# Patient Record
Sex: Female | Born: 1975 | Race: White | Hispanic: Yes | Marital: Married | Smoking: Never smoker
Health system: Southern US, Community
[De-identification: ages and names within clinical notes are randomized; demographics above are authoritative.]

## PROBLEM LIST (undated history)

## (undated) DIAGNOSIS — R112 Nausea with vomiting, unspecified: Secondary | ICD-10-CM

## (undated) DIAGNOSIS — R87619 Unspecified abnormal cytological findings in specimens from cervix uteri: Secondary | ICD-10-CM

## (undated) DIAGNOSIS — D219 Benign neoplasm of connective and other soft tissue, unspecified: Secondary | ICD-10-CM

## (undated) DIAGNOSIS — Z9889 Other specified postprocedural states: Secondary | ICD-10-CM

## (undated) DIAGNOSIS — O9902 Anemia complicating childbirth: Secondary | ICD-10-CM

## (undated) DIAGNOSIS — Z6740 Type O blood, Rh positive: Secondary | ICD-10-CM

## (undated) DIAGNOSIS — O039 Complete or unspecified spontaneous abortion without complication: Secondary | ICD-10-CM

## (undated) DIAGNOSIS — IMO0002 Reserved for concepts with insufficient information to code with codable children: Secondary | ICD-10-CM

## (undated) DIAGNOSIS — D259 Leiomyoma of uterus, unspecified: Secondary | ICD-10-CM

## (undated) DIAGNOSIS — D62 Acute posthemorrhagic anemia: Secondary | ICD-10-CM

## (undated) HISTORY — DX: Type O blood, Rh positive: Z67.40

## (undated) HISTORY — DX: Complete or unspecified spontaneous abortion without complication: O03.9

## (undated) HISTORY — DX: Leiomyoma of uterus, unspecified: D25.9

---

## 2010-02-23 ENCOUNTER — Ambulatory Visit: Payer: Self-pay | Admitting: Gynecology

## 2010-02-23 ENCOUNTER — Other Ambulatory Visit: Admission: RE | Admit: 2010-02-23 | Discharge: 2010-02-23 | Payer: Self-pay | Admitting: Gynecology

## 2010-03-16 ENCOUNTER — Ambulatory Visit: Payer: Self-pay | Admitting: Gynecology

## 2010-03-27 ENCOUNTER — Ambulatory Visit: Payer: Self-pay | Admitting: Gynecology

## 2010-03-30 ENCOUNTER — Ambulatory Visit: Payer: Self-pay | Admitting: Gynecology

## 2010-03-30 ENCOUNTER — Emergency Department (HOSPITAL_COMMUNITY): Admission: EM | Admit: 2010-03-30 | Discharge: 2010-03-31 | Payer: Self-pay | Admitting: Emergency Medicine

## 2010-04-02 ENCOUNTER — Ambulatory Visit: Payer: Self-pay | Admitting: Women's Health

## 2010-04-07 ENCOUNTER — Ambulatory Visit: Payer: Self-pay | Admitting: Gynecology

## 2010-04-13 ENCOUNTER — Ambulatory Visit: Payer: Self-pay | Admitting: Gynecology

## 2010-04-22 ENCOUNTER — Ambulatory Visit: Payer: Self-pay | Admitting: Gynecology

## 2010-07-06 ENCOUNTER — Other Ambulatory Visit: Payer: Self-pay | Admitting: Gynecology

## 2010-07-06 DIAGNOSIS — Z1231 Encounter for screening mammogram for malignant neoplasm of breast: Secondary | ICD-10-CM

## 2010-07-17 ENCOUNTER — Ambulatory Visit (HOSPITAL_COMMUNITY): Payer: BC Managed Care – PPO | Attending: Gynecology

## 2011-02-16 ENCOUNTER — Encounter: Payer: Self-pay | Admitting: Gynecology

## 2011-02-16 ENCOUNTER — Ambulatory Visit (INDEPENDENT_AMBULATORY_CARE_PROVIDER_SITE_OTHER): Payer: BC Managed Care – PPO | Admitting: Gynecology

## 2011-02-16 VITALS — BP 118/70

## 2011-02-16 DIAGNOSIS — R823 Hemoglobinuria: Secondary | ICD-10-CM

## 2011-02-16 DIAGNOSIS — N390 Urinary tract infection, site not specified: Secondary | ICD-10-CM

## 2011-02-16 DIAGNOSIS — N898 Other specified noninflammatory disorders of vagina: Secondary | ICD-10-CM

## 2011-02-16 DIAGNOSIS — R300 Dysuria: Secondary | ICD-10-CM

## 2011-02-16 DIAGNOSIS — B373 Candidiasis of vulva and vagina: Secondary | ICD-10-CM

## 2011-02-16 IMAGING — US US OB COMP LESS 14 WK
1 series · 13 of 28 positions shown · non-contrast
Comparison: None

CLINICAL DATA: Vaginal bleeding, cramping, pregnant per patient;
reportedly had a viable fetus at oblique office 2 days ago per
nurse, though no results from any prior imaging studies are
available; no quantitative beta HCG for correlation

OBSTETRIC <14 WK US AND TRANSVAGINAL OB US
TECHNIQUE: Both transabdominal and transvaginal ultrasound
examinations were performed for complete evaluation of the
gestation as well as the maternal uterus, adnexal regions, and
pelvic cul-de-sac.  Transvaginal technique was performed to assess
early pregnancy.

[Series 1: us ob comp less 14 wk · 0.24mm/px · 13 of 40 slices shown]
[im 2/40]
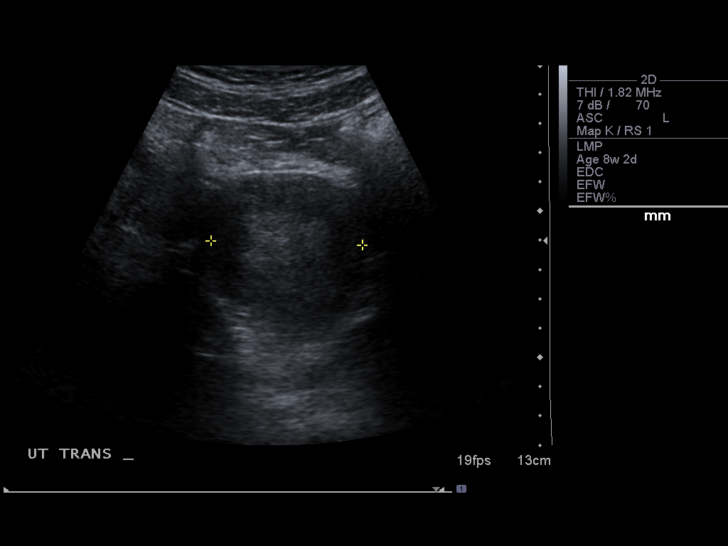
[im 5/40]
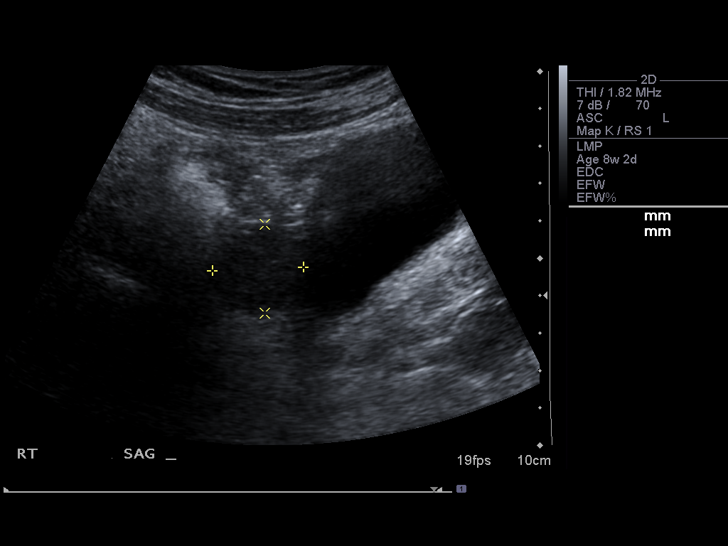
[im 8/40]
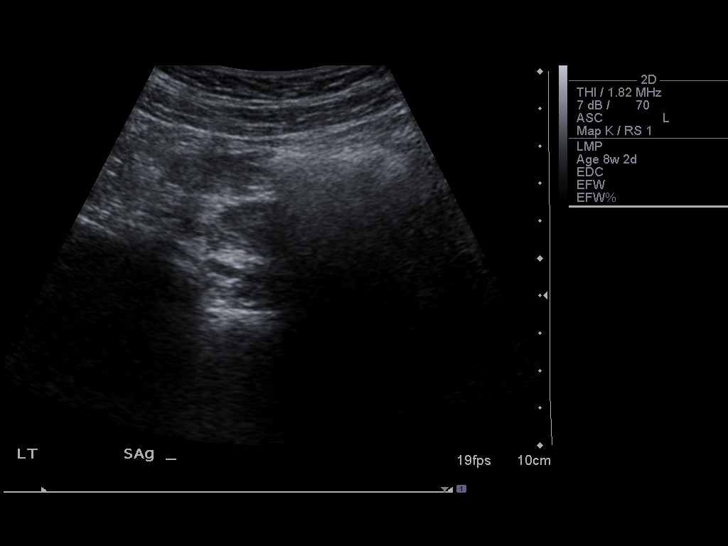
[im 11/40]
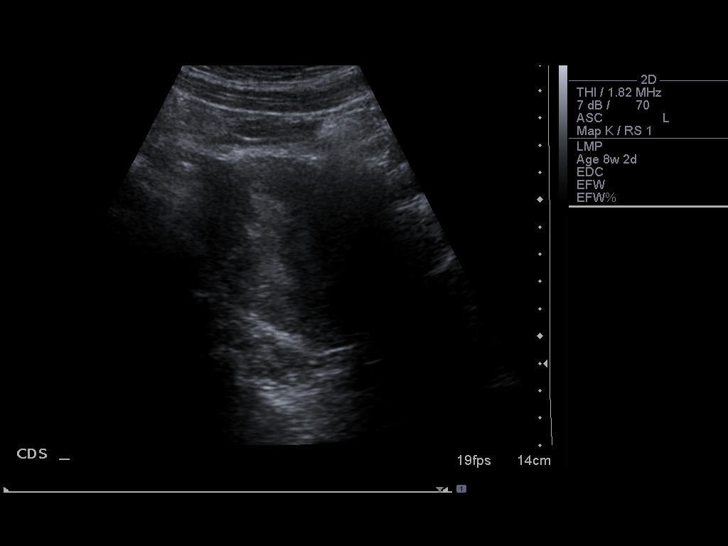
[im 14/40]
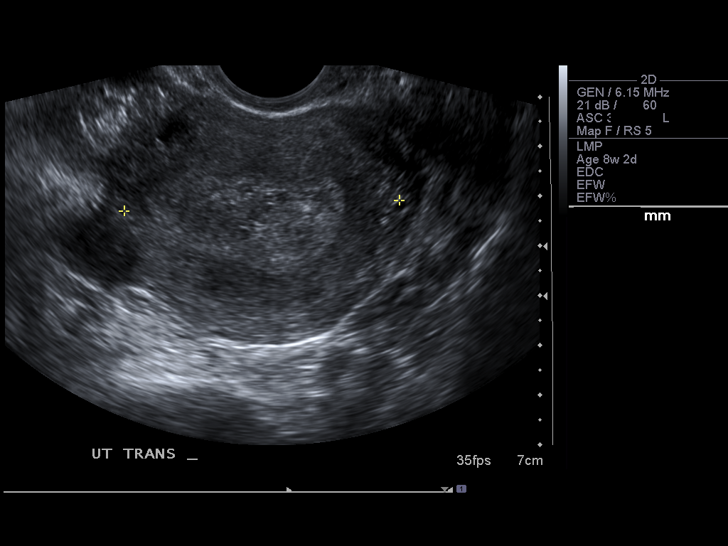
[im 16/40]
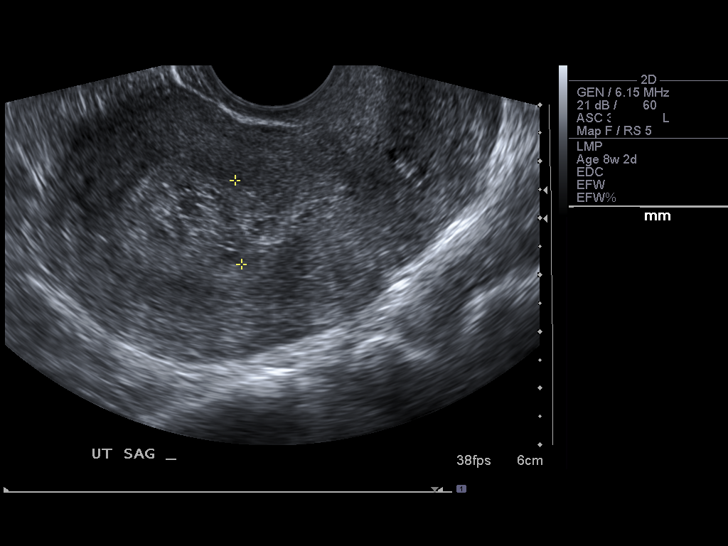
[im 21/40]
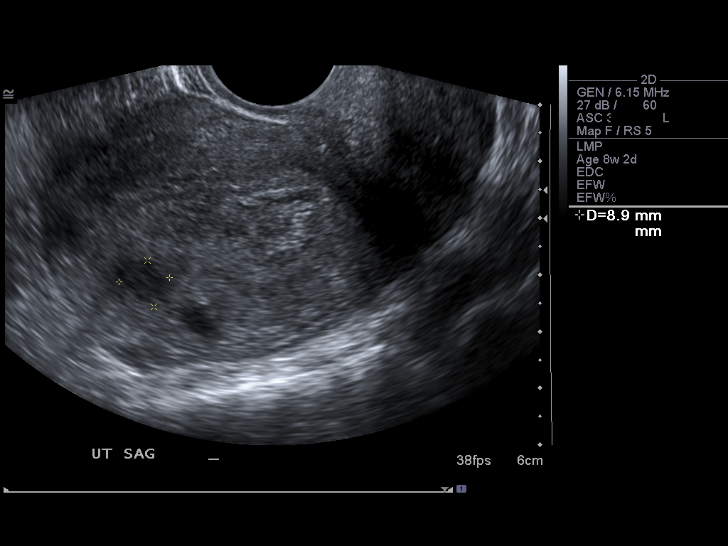
[im 24/40]
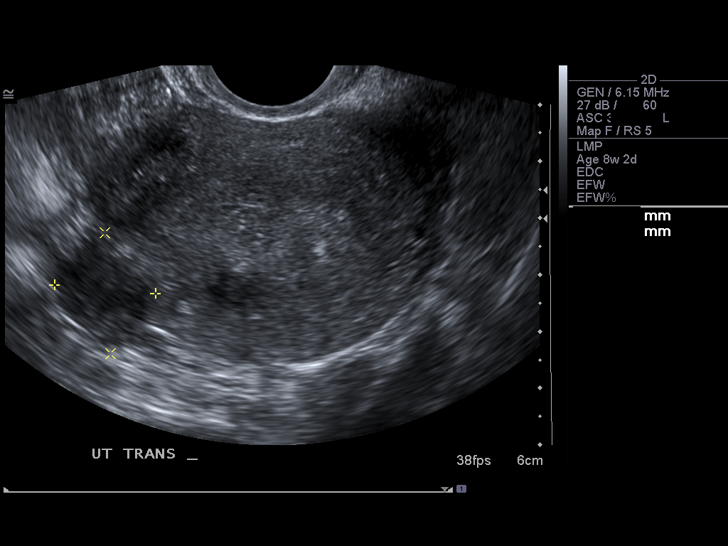
[im 27/40]
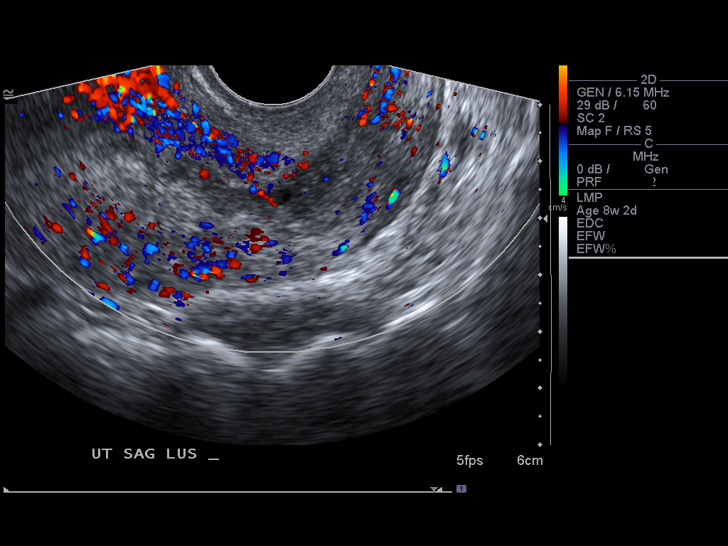
[im 29/40]
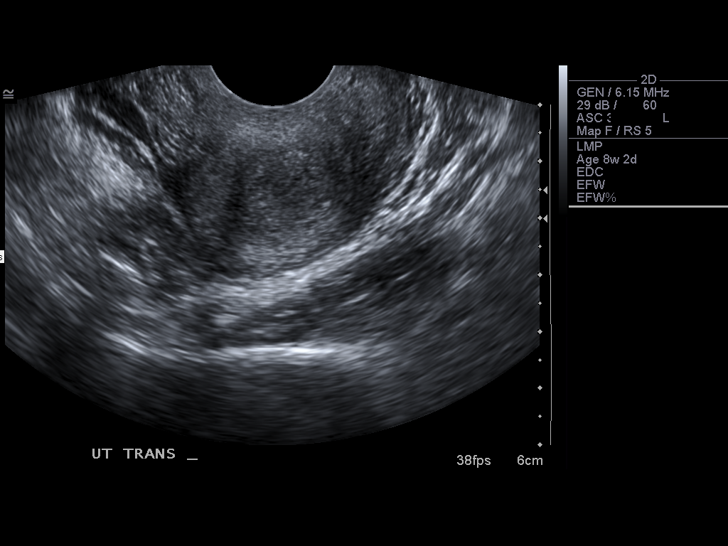
[im 32/40]
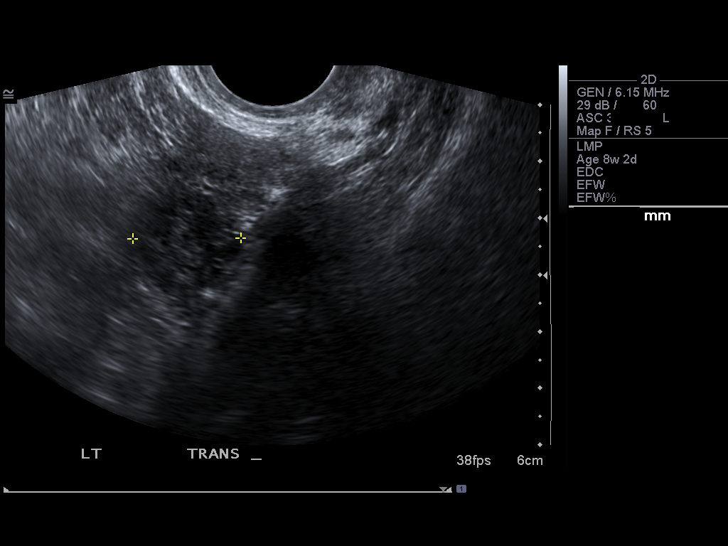
[im 35/40]
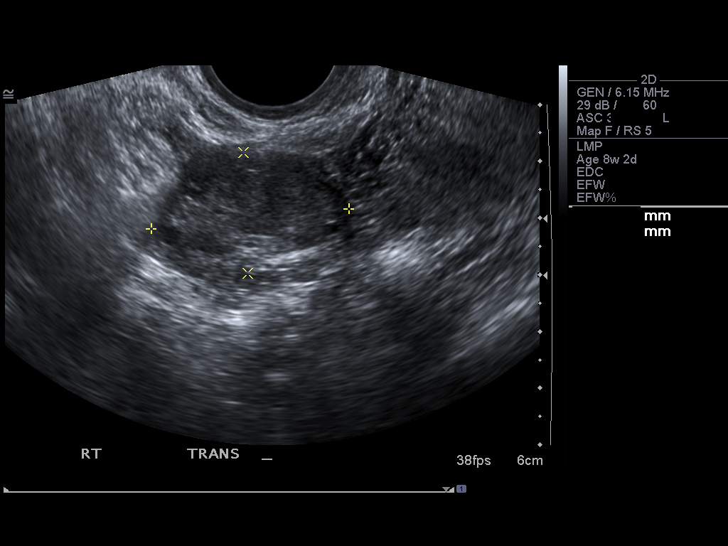
[im 38/40]
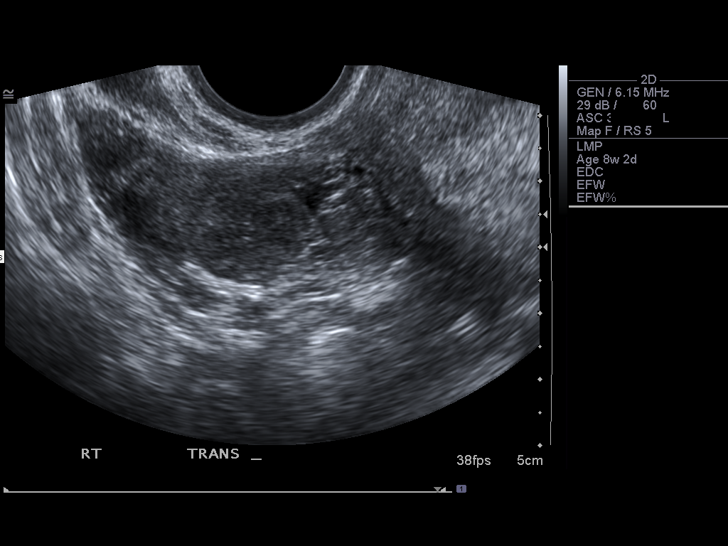

[13 of 28 positions shown; findings below may reference images not displayed]

Intrauterine gestational sac:  None identified
Yolk sac: None identified
Embryo: None identified
Cardiac Activity: N/A
Heart Rate: N/A bpm

Maternal uterus/adnexae:
Uterus measures 8.4 cm length by 5.2 cm AP by 5.5 cm transverse.
Endometrial complex is thickened and heterogeneous, measuring up to
1.9 cm thick.
No gestational sac identified.
No free pelvic fluid.
Several small uterine fibroids identified, largest right lateral
fundal 1.8 x 2.1 x 1.8 cm.
No adnexal masses.
Right ovary normal size and morphology, 3.5 x 2.1 x 2-0.5 cm,
containing blood flow on color Doppler imaging.
Left ovary normal size and morphology, 2.4 x 1.8 x 1.9 cm,
containing blood flow on color Doppler imaging.
IMPRESSION: No intrauterine gestation identified.
If the patient truly had a viable intrauterine gestation on imaging
2 days ago, findings would be most consistent with spontaneous
abortion, with the thickened endometrial complex potentially
representing blood within endometrial canal.
However, in the absence of a quantitative beta HCG for correlation
and with inavailability of results of prior testing, differential
diagnosis includes ectopic pregnancy, a spontaneous abortion, and
intrauterine pregnancy too early to visualize.
Correlation with prior outside imaging recommended.
Serial quantitative beta HCG and/or ultrasound followup recommended
to definitively exclude ectopic pregnancy.

## 2011-02-16 MED ORDER — FLUCONAZOLE 150 MG PO TABS: 150.0000 mg | ORAL_TABLET | Freq: Once | ORAL | Status: AC

## 2011-02-16 MED ORDER — NITROFURANTOIN MONOHYD MACRO 100 MG PO CAPS: 100.0000 mg | ORAL_CAPSULE | Freq: Two times a day (BID) | ORAL | Status: AC

## 2011-02-16 NOTE — Progress Notes (Signed)
Patient presented to the office today stating for the past 3 days she has had dysuria and frequency and when she wipes notice some blood and also some clumps of white-like material. She is in a monogamous relationship and is now using any form of contraception and her last menstrual period was approximately 3 weeks ago. Patient 89 fever chills nausea or vomiting.  Pelvic exam: Bartholin urethra Skene glands within normal limits Vagina: Slight white discharge was noted Cervix: No lesions or discharge Bimanual exam: Not done patient did have some suprapubic tenderness.  Urinalysis 3-6 WBC 5-8 RBC and 1+ bacteria  Assessment: #1 moniliasis #2 urinary tract infection  Plan: Diflucan 150 mg one tablet today. Uribel antispasmodic agent 1 tablet 4 times a day for 2 days And the Macrobid 100 mg one tablet twice a day for one week. She is encouraged to continue her fluid intake. We'll see her back in one to 2 weeks since she is overdue for her annual exam.

## 2011-02-25 ENCOUNTER — Encounter: Payer: BC Managed Care – PPO | Admitting: Gynecology

## 2011-02-26 ENCOUNTER — Other Ambulatory Visit (HOSPITAL_COMMUNITY)
Admission: RE | Admit: 2011-02-26 | Discharge: 2011-02-26 | Disposition: A | Discharge status: Home / Self Care | Payer: BC Managed Care – PPO | Source: Ambulatory Visit | Attending: Gynecology | Admitting: Gynecology

## 2011-02-26 ENCOUNTER — Encounter: Payer: Self-pay | Admitting: Gynecology

## 2011-02-26 ENCOUNTER — Ambulatory Visit (INDEPENDENT_AMBULATORY_CARE_PROVIDER_SITE_OTHER): Payer: BC Managed Care – PPO | Admitting: Gynecology

## 2011-02-26 VITALS — BP 120/70 | Ht 60.75 in | Wt 120.0 lb

## 2011-02-26 DIAGNOSIS — N979 Female infertility, unspecified: Secondary | ICD-10-CM

## 2011-02-26 DIAGNOSIS — R823 Hemoglobinuria: Secondary | ICD-10-CM

## 2011-02-26 DIAGNOSIS — Z13220 Encounter for screening for lipoid disorders: Secondary | ICD-10-CM

## 2011-02-26 DIAGNOSIS — Z01419 Encounter for gynecological examination (general) (routine) without abnormal findings: Secondary | ICD-10-CM | POA: Insufficient documentation

## 2011-02-26 NOTE — Progress Notes (Signed)
Delaina Fetsch Nov 06, 1975 [id number]   History:    35 y.o.  for annual exam and wanted discussed her infertility as well. Patient with history of a D&E at [redacted] weeks gestation because of missed AB last year on October 2011. She states her cycles are regular every 30-33 days and the last 2-3 days. She was weighing 118 pounds last year is up to 120. Her partner has never had a child with anyone else. Patient denies any past history of any PID. No infertility evaluation.  Past medical history,surgical history, family history and social history were all reviewed and documented in the EPIC chart.  ROS:  Was performed and pertinent positives and negatives are included in the history.  Exam: chaperone present BP 120/70  Ht 5' 0.75" (1.543 m)  Wt 120 lb (54.432 kg)  BMI 22.86 kg/m2  LMP 01/31/2011  Body mass index is 22.86 kg/(m^2).  General appearance : Well developed well nourished female. No acute distress HEENT: Neck supple, trachea midline, no carotid bruits, no thyroidmegaly Lungs: Clear to auscultation, no rhonchi or wheezes, or rib retractions  Heart: Regular rate and rhythm, no murmurs or gallops Breast:Examined in sitting and supine position were symmetrical in appearance, no palpable masses or tenderness,  no skin retraction, no nipple inversion, no nipple discharge, no skin discoloration, no axillary or supraclavicular lymphadenopathy Abdomen: no palpable masses or tenderness, no rebound or guarding Extremities: no edema or skin discoloration or tenderness  Pelvic:  Bartholin, Urethra, Skene Glands: Within normal limits             Vagina: No gross lesions or discharge  Cervix: No gross lesions or discharge  Uterus  anteverted, normal size, shape and consistency, non-tender and mobile  Adnexa  Without masses or tenderness  Anus and perineum  normal   Rectovaginal  normal sphincter tone without palpated masses or tenderness             Hemoccult not done     Assessment/Plan:  35 y.o.  female for annual exam which was normal. We had a discussion about scheduling a hysterosalpingogram during the proliferative phase of her next cycle as well as obtaining a semen analysis from her partner for them to set an appointment to see me one week afterwards. We'll also instructed to use the ovulation predictor kit for timing of intercourse and to start prenatal vitamins. Her CBC urinalysis and Pap smear was done today. She will call the office when her cycle start so that we can schedule the HSG and I've given her prescription Vibramycin 100 mg to take one tablet twice a day the day before day of and after the procedure for prophylaxis. All instructions were provided in written format in Spanish all questions rancher will follow accordingly.    Ok Edwards MD, 5:00 PM 02/26/2011

## 2011-02-26 NOTE — Patient Instructions (Signed)
Remember what the next cycle to start coming to the first day of your menses. In call the office when you're cycle start so that we can schedule an HSG. Remember to take the antibiotic (Vibramycin) one tablet twice a day starting the day before the scheduled procedure. The day that you're scheduled for the HSG remember to come by the office and drop off her husband's semen analysis (abstaining from sex 3 days prior). Make an appointment to see me one week after the above studies.

## 2011-03-01 ENCOUNTER — Telehealth: Payer: Self-pay | Admitting: *Deleted

## 2011-03-01 ENCOUNTER — Other Ambulatory Visit: Payer: Self-pay | Admitting: Gynecology

## 2011-03-01 DIAGNOSIS — N979 Female infertility, unspecified: Secondary | ICD-10-CM

## 2011-03-01 NOTE — Telephone Encounter (Signed)
Called pt to give appointment time and date on 03/09/11 at 8:00am for HSG at [facility]. Pt informed no sex and to take medication as directed by Dr.JF.

## 2011-03-09 ENCOUNTER — Ambulatory Visit (HOSPITAL_COMMUNITY)
Admission: RE | Admit: 2011-03-09 | Discharge: 2011-03-09 | Disposition: A | Discharge status: Home / Self Care | Payer: BC Managed Care – PPO | Source: Ambulatory Visit | Attending: Gynecology | Admitting: Gynecology

## 2011-03-09 ENCOUNTER — Other Ambulatory Visit: Payer: Self-pay | Admitting: *Deleted

## 2011-03-09 DIAGNOSIS — N979 Female infertility, unspecified: Secondary | ICD-10-CM

## 2011-03-09 MED ORDER — IOHEXOL 300 MG/ML  SOLN
16.0000 mL | Freq: Once | INTRAMUSCULAR | Status: AC | PRN
  Administered 2011-03-09: 16 mL

## 2011-03-11 ENCOUNTER — Other Ambulatory Visit: Payer: Self-pay | Admitting: *Deleted

## 2011-03-11 DIAGNOSIS — N979 Female infertility, unspecified: Secondary | ICD-10-CM

## 2011-03-22 ENCOUNTER — Encounter: Payer: Self-pay | Admitting: Gynecology

## 2011-03-22 ENCOUNTER — Ambulatory Visit (INDEPENDENT_AMBULATORY_CARE_PROVIDER_SITE_OTHER): Payer: BC Managed Care – PPO | Admitting: Gynecology

## 2011-03-22 VITALS — BP 110/78

## 2011-03-22 DIAGNOSIS — N979 Female infertility, unspecified: Secondary | ICD-10-CM

## 2011-03-22 DIAGNOSIS — N469 Male infertility, unspecified: Secondary | ICD-10-CM

## 2011-03-22 NOTE — Progress Notes (Signed)
Patient is a 35 year old gravida 1 para 0 AB 1 who presented with her husband to the office today for consultation. Patient with secondary infertility. Last year she had a first trimester missed AB. Workup recently consisted of a semen analysis demonstrated normal count but viscous seen in with 75% of the sperm motile. Her recent Pap smear CBC cholesterol and urinalysis were all normal. Her HSG demonstrated bilateral tubal patency but a filling defect in the fundal region of the uterus.  We discussed the findings of a hysterosalpingogram and that I would recommend that for followup after the start of her next cycle she returned to the office for sonohysterogram to rule out endometrial polyp or submucous myoma before attempting pregnancy again. Was had a she has been cleared we discussed about placing her on clomiphene citrate desynchronized her cycles as they ranged between 28 and 33 days. Also to plan concurrently sperm washing and IUI. She will continue her prenatal vitamins and we'll follow accordingly.

## 2011-04-01 ENCOUNTER — Other Ambulatory Visit: Payer: Self-pay | Admitting: Gynecology

## 2011-04-01 DIAGNOSIS — N96 Recurrent pregnancy loss: Secondary | ICD-10-CM

## 2011-04-06 ENCOUNTER — Other Ambulatory Visit: Payer: BC Managed Care – PPO

## 2011-04-06 ENCOUNTER — Ambulatory Visit: Payer: BC Managed Care – PPO | Admitting: Gynecology

## 2011-04-06 ENCOUNTER — Ambulatory Visit (INDEPENDENT_AMBULATORY_CARE_PROVIDER_SITE_OTHER): Payer: BC Managed Care – PPO | Admitting: Gynecology

## 2011-04-06 ENCOUNTER — Ambulatory Visit (INDEPENDENT_AMBULATORY_CARE_PROVIDER_SITE_OTHER): Payer: BC Managed Care – PPO

## 2011-04-06 ENCOUNTER — Other Ambulatory Visit: Payer: Self-pay | Admitting: Gynecology

## 2011-04-06 DIAGNOSIS — N96 Recurrent pregnancy loss: Secondary | ICD-10-CM

## 2011-04-06 DIAGNOSIS — D250 Submucous leiomyoma of uterus: Secondary | ICD-10-CM

## 2011-04-06 DIAGNOSIS — D259 Leiomyoma of uterus, unspecified: Secondary | ICD-10-CM

## 2011-04-06 DIAGNOSIS — N979 Female infertility, unspecified: Secondary | ICD-10-CM

## 2011-04-06 DIAGNOSIS — D251 Intramural leiomyoma of uterus: Secondary | ICD-10-CM

## 2011-04-06 HISTORY — DX: Leiomyoma of uterus, unspecified: D25.9

## 2011-04-06 NOTE — Progress Notes (Signed)
Patient is a 35 year old gravida 1 para 0 AB 1 who presented to the office today for sonohysterogram. Patient has had secondary infertility and as part of her workup she had a hysterosalpingogram which demonstrated a possible uterine myoma.  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  It is recommended the patient undergo a resectoscopic myomectomy and ambulatory setting before attempting to conceive in the near future. Literature information was provided. She will need to come to the office the day before the procedure for preoperative counseling as well as to place a laminaria because it required cervical dilatation today to place the intrauterine catheter for the sonohysterogram. All questions were answered and we'll follow accordingly.

## 2011-04-07 ENCOUNTER — Telehealth: Payer: Self-pay

## 2011-04-07 NOTE — Telephone Encounter (Addendum)
I called patient to schedule outpatient surgery.  Dr. Glenetta Hew asked to try and do it in her proliferative phase. Her periods are regular and she anticipates her next one around Dec. 3rd.  We scheduled her for Dec 13th. She will come the day before at 3:30pm for laminary insertion.  She was mailed pamphlet and financial letter.  I did confirm with Vic Ripper that he could be there to support the morcellator.  ka

## 2011-04-16 ENCOUNTER — Telehealth: Payer: Self-pay

## 2011-04-16 NOTE — Telephone Encounter (Signed)
Patient called me yesterday and asked regarding signing a records release form in order to obtain her records.  She wants me to leave one at the front desk and her husband is in the area and will come by and pick it up today.  I called her back and told her that there will be $29 charge if she gets her records but no charge if we send them to another doctor. She said there was not another doctor that she just wanted to have them for herself.  I told her there would be a charge.  I left form at front desk for husband to pick up.

## 2011-04-26 ENCOUNTER — Telehealth: Payer: Self-pay

## 2011-04-26 NOTE — Telephone Encounter (Signed)
Patient called in my voice mail and asked me to cancel her surgery.  She had already talked with front desk and cancelled her preop consult appt. She said this is not a good time and she will probably need to r/s for January but she will call me when ready to reschedule.  I will hold her chart and follow up with her if I do not hear back from her.

## 2011-05-12 ENCOUNTER — Ambulatory Visit: Payer: BC Managed Care – PPO | Admitting: Gynecology

## 2011-05-13 ENCOUNTER — Encounter (HOSPITAL_BASED_OUTPATIENT_CLINIC_OR_DEPARTMENT_OTHER): Admission: RE | Payer: Self-pay | Source: Ambulatory Visit

## 2011-05-13 ENCOUNTER — Ambulatory Visit (HOSPITAL_BASED_OUTPATIENT_CLINIC_OR_DEPARTMENT_OTHER): Admission: RE | Admit: 2011-05-13 | Payer: BC Managed Care – PPO | Source: Ambulatory Visit | Admitting: Gynecology

## 2011-05-13 SURGERY — HYSTEROSCOPY, USING RESECTOSCOPE
Anesthesia: General

## 2011-06-08 ENCOUNTER — Ambulatory Visit (INDEPENDENT_AMBULATORY_CARE_PROVIDER_SITE_OTHER): Payer: BC Managed Care – PPO | Admitting: Gynecology

## 2011-06-08 ENCOUNTER — Encounter: Payer: Self-pay | Admitting: Gynecology

## 2011-06-08 VITALS — BP 120/82

## 2011-06-08 DIAGNOSIS — D250 Submucous leiomyoma of uterus: Secondary | ICD-10-CM

## 2011-06-08 DIAGNOSIS — N979 Female infertility, unspecified: Secondary | ICD-10-CM

## 2011-06-08 NOTE — Patient Instructions (Signed)
Use ovulation predictor kit. If not pregnant in 3-4 months will repeat sonohysterogram if submucus myoma/polyp still there would recommend surgical removal via hysteroscopy as outpatient procedure.

## 2011-06-08 NOTE — Progress Notes (Signed)
Patient 36 year old gravida 1 para 0 AB 1 who presented to the office today for discussion of her secondary infertility. She was seen the office on November 6 and had a sonohysterogram since earlier in the year she had a spontaneous miscarriage in the first trimester. Afterwards she had a hysterosalpingogram which demonstrated tubal patency and was a questionable fundal lesion. 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 Right and left ovary were normal. On sonohysterogram there was a submucous myoma measuring 19 x 17 x 7 mm anterior located.  Patient returned to the office for discussion and to see we can start ovulation induction medication at the present time. She is having normal menstrual cycles. Every 28-30 days. Her husband had a normal semen analysis last year with 75% motility. I've recommended she consider resectoscopic myomectomy for tenting to get pregnant. She would like to wait 3-4 months of attempting to get pregnant on her own. She is fully aware of persistence of inability to conceive where she does the risk of miscarriage. She will maintain a calendar of her cycles in time of ovulation for next 3 months if she does not conceive she'll return back to the office we'll repeat the sonohysterogram and a submucous myoma still present we'll proceed with a resectoscopic myomectomy as initially recommended. Meanwhile she'll continue her prenatal vitamins.

## 2011-10-20 LAB — OB RESULTS CONSOLE GC/CHLAMYDIA
Chlamydia: NEGATIVE
Gonorrhea: NEGATIVE

## 2011-10-28 LAB — OB RESULTS CONSOLE RUBELLA ANTIBODY, IGM: Rubella: IMMUNE

## 2012-03-31 LAB — OB RESULTS CONSOLE GBS: GBS: NEGATIVE

## 2012-04-26 ENCOUNTER — Other Ambulatory Visit: Payer: Self-pay | Admitting: Obstetrics

## 2012-04-27 ENCOUNTER — Inpatient Hospital Stay (HOSPITAL_COMMUNITY)
Admission: AD | Admit: 2012-04-27 | Discharge: 2012-04-28 | Disposition: A | Discharge status: Home / Self Care | Payer: BC Managed Care – PPO | Source: Ambulatory Visit | Attending: Obstetrics | Admitting: Obstetrics

## 2012-04-27 ENCOUNTER — Encounter (HOSPITAL_COMMUNITY): Payer: Self-pay | Admitting: *Deleted

## 2012-04-27 ENCOUNTER — Inpatient Hospital Stay (HOSPITAL_COMMUNITY)
Admission: AD | Admit: 2012-04-27 | Discharge: 2012-04-27 | Disposition: A | Discharge status: Home / Self Care | Payer: BC Managed Care – PPO | Source: Ambulatory Visit | Attending: Obstetrics and Gynecology | Admitting: Obstetrics and Gynecology

## 2012-04-27 ENCOUNTER — Inpatient Hospital Stay (HOSPITAL_COMMUNITY): Payer: BC Managed Care – PPO

## 2012-04-27 DIAGNOSIS — O479 False labor, unspecified: Secondary | ICD-10-CM | POA: Insufficient documentation

## 2012-04-27 MED ORDER — ZOLPIDEM TARTRATE 5 MG PO TABS
5.0000 mg | ORAL_TABLET | Freq: Once | ORAL | Status: AC
  Administered 2012-04-27: 5 mg via ORAL
  Filled 2012-04-27: qty 1

## 2012-04-27 NOTE — MAU Note (Signed)
Pt reports uc's q 4-5 minutes

## 2012-04-27 NOTE — MAU Note (Signed)
Dr. Billy Coast updated on pt status and notified of non-reactive NST. Plan of care D/W Dr. Billy Coast. Pt to be monitored for 30 more minutes. If reactive pt may ambulate and return to MAU for recheck of cervix. If strip remains non reactive after 30 more minutes, order BPP.

## 2012-04-27 NOTE — MAU Note (Signed)
Pt states she was evaluated earlier today and continues to have contractions after resting with a sleeping pill

## 2012-04-28 ENCOUNTER — Inpatient Hospital Stay (HOSPITAL_COMMUNITY)
Admission: AD | Admit: 2012-04-28 | Discharge: 2012-05-02 | DRG: 370 | Disposition: A | Discharge status: Home / Self Care | Payer: BC Managed Care – PPO | Source: Ambulatory Visit | Attending: Obstetrics | Admitting: Obstetrics

## 2012-04-28 ENCOUNTER — Encounter (HOSPITAL_COMMUNITY): Payer: Self-pay | Admitting: Anesthesiology

## 2012-04-28 ENCOUNTER — Encounter (HOSPITAL_COMMUNITY): Payer: Self-pay | Admitting: *Deleted

## 2012-04-28 DIAGNOSIS — O324XX0 Maternal care for high head at term, not applicable or unspecified: Secondary | ICD-10-CM | POA: Diagnosis present

## 2012-04-28 DIAGNOSIS — O3660X0 Maternal care for excessive fetal growth, unspecified trimester, not applicable or unspecified: Secondary | ICD-10-CM | POA: Diagnosis present

## 2012-04-28 DIAGNOSIS — D4959 Neoplasm of unspecified behavior of other genitourinary organ: Secondary | ICD-10-CM | POA: Diagnosis present

## 2012-04-28 DIAGNOSIS — O34599 Maternal care for other abnormalities of gravid uterus, unspecified trimester: Secondary | ICD-10-CM | POA: Diagnosis present

## 2012-04-28 DIAGNOSIS — D62 Acute posthemorrhagic anemia: Secondary | ICD-10-CM | POA: Diagnosis not present

## 2012-04-28 DIAGNOSIS — D259 Leiomyoma of uterus, unspecified: Secondary | ICD-10-CM | POA: Diagnosis present

## 2012-04-28 DIAGNOSIS — O09529 Supervision of elderly multigravida, unspecified trimester: Secondary | ICD-10-CM | POA: Diagnosis present

## 2012-04-28 DIAGNOSIS — O9903 Anemia complicating the puerperium: Secondary | ICD-10-CM | POA: Diagnosis not present

## 2012-04-28 DIAGNOSIS — O9902 Anemia complicating childbirth: Secondary | ICD-10-CM | POA: Diagnosis not present

## 2012-04-28 HISTORY — DX: Benign neoplasm of connective and other soft tissue, unspecified: D21.9

## 2012-04-28 HISTORY — DX: Acute posthemorrhagic anemia: D62

## 2012-04-28 HISTORY — DX: Anemia complicating childbirth: O99.02

## 2012-04-28 HISTORY — DX: Reserved for concepts with insufficient information to code with codable children: IMO0002

## 2012-04-28 HISTORY — DX: Unspecified abnormal cytological findings in specimens from cervix uteri: R87.619

## 2012-04-28 LAB — CBC
Hemoglobin: 12.3 g/dL (ref 12.0–15.0)
MCV: 108.2 fL — ABNORMAL HIGH (ref 78.0–100.0)
Platelets: 226 10*3/uL (ref 150–400)
RBC: 3.88 MIL/uL (ref 3.87–5.11)
WBC: 15.7 10*3/uL — ABNORMAL HIGH (ref 4.0–10.5)

## 2012-04-28 LAB — ABO/RH: ABO/RH(D): O POS

## 2012-04-28 MED ORDER — LACTATED RINGERS IV SOLN
500.0000 mL | INTRAVENOUS | Status: DC | PRN
  Administered 2012-04-28 (×2): 500 mL via INTRAVENOUS

## 2012-04-28 MED ORDER — LIDOCAINE HCL (PF) 1 % IJ SOLN: 30.0000 mL | INTRAMUSCULAR | Status: DC | PRN

## 2012-04-28 MED ORDER — DIPHENHYDRAMINE HCL 50 MG/ML IJ SOLN: 12.5000 mg | INTRAMUSCULAR | Status: DC | PRN

## 2012-04-28 MED ORDER — LACTATED RINGERS IV SOLN
INTRAVENOUS | Status: DC
  Administered 2012-04-28 (×3): via INTRAVENOUS

## 2012-04-28 MED ORDER — FENTANYL 2.5 MCG/ML BUPIVACAINE 1/10 % EPIDURAL INFUSION (WH - ANES)
14.0000 mL/h | INTRAMUSCULAR | Status: DC
  Filled 2012-04-28 (×2): qty 125

## 2012-04-28 MED ORDER — LIDOCAINE HCL (PF) 1 % IJ SOLN
INTRAMUSCULAR | Status: DC | PRN
  Administered 2012-04-28 (×2): 4 mL

## 2012-04-28 MED ORDER — CITRIC ACID-SODIUM CITRATE 334-500 MG/5ML PO SOLN
30.0000 mL | ORAL | Status: DC | PRN
  Filled 2012-04-28: qty 15

## 2012-04-28 MED ORDER — OXYTOCIN 40 UNITS IN LACTATED RINGERS INFUSION - SIMPLE MED: 62.5000 mL/h | INTRAVENOUS | Status: DC

## 2012-04-28 MED ORDER — ONDANSETRON HCL 4 MG/2ML IJ SOLN: 4.0000 mg | Freq: Four times a day (QID) | INTRAMUSCULAR | Status: DC | PRN

## 2012-04-28 MED ORDER — ZOLPIDEM TARTRATE 5 MG PO TABS: 5.0000 mg | ORAL_TABLET | Freq: Every evening | ORAL | Status: DC | PRN

## 2012-04-28 MED ORDER — OXYTOCIN 40 UNITS IN LACTATED RINGERS INFUSION - SIMPLE MED
1.0000 m[IU]/min | INTRAVENOUS | Status: DC
  Administered 2012-04-28: 2 m[IU]/min via INTRAVENOUS

## 2012-04-28 MED ORDER — OXYCODONE-ACETAMINOPHEN 5-325 MG PO TABS: 1.0000 | ORAL_TABLET | ORAL | Status: DC | PRN

## 2012-04-28 MED ORDER — ACETAMINOPHEN 325 MG PO TABS: 650.0000 mg | ORAL_TABLET | ORAL | Status: DC | PRN

## 2012-04-28 MED ORDER — PHENYLEPHRINE 40 MCG/ML (10ML) SYRINGE FOR IV PUSH (FOR BLOOD PRESSURE SUPPORT): 80.0000 ug | PREFILLED_SYRINGE | INTRAVENOUS | Status: DC | PRN

## 2012-04-28 MED ORDER — OXYTOCIN BOLUS FROM INFUSION: 500.0000 mL | INTRAVENOUS | Status: DC

## 2012-04-28 MED ORDER — PHENYLEPHRINE 40 MCG/ML (10ML) SYRINGE FOR IV PUSH (FOR BLOOD PRESSURE SUPPORT)
80.0000 ug | PREFILLED_SYRINGE | INTRAVENOUS | Status: DC | PRN
  Filled 2012-04-28: qty 5

## 2012-04-28 MED ORDER — TERBUTALINE SULFATE 1 MG/ML IJ SOLN: 0.2500 mg | Freq: Once | INTRAMUSCULAR | Status: AC | PRN

## 2012-04-28 MED ORDER — IBUPROFEN 600 MG PO TABS: 600.0000 mg | ORAL_TABLET | Freq: Four times a day (QID) | ORAL | Status: DC | PRN

## 2012-04-28 MED ORDER — EPHEDRINE 5 MG/ML INJ
10.0000 mg | INTRAVENOUS | Status: DC | PRN
  Administered 2012-04-29: 5 mg via INTRAVENOUS

## 2012-04-28 MED ORDER — MISOPROSTOL 25 MCG QUARTER TABLET: 25.0000 ug | ORAL_TABLET | ORAL | Status: DC | PRN

## 2012-04-28 MED ORDER — OXYTOCIN 40 UNITS IN LACTATED RINGERS INFUSION - SIMPLE MED
1.0000 m[IU]/min | INTRAVENOUS | Status: DC
Start: 2012-04-28 — End: 2012-04-29
  Filled 2012-04-28: qty 1000

## 2012-04-28 MED ORDER — EPHEDRINE 5 MG/ML INJ
10.0000 mg | INTRAVENOUS | Status: DC | PRN
  Filled 2012-04-28: qty 4

## 2012-04-28 MED ORDER — LACTATED RINGERS IV SOLN
500.0000 mL | Freq: Once | INTRAVENOUS | Status: AC
  Administered 2012-04-28: 1000 mL via INTRAVENOUS

## 2012-04-28 MED ORDER — FENTANYL 2.5 MCG/ML BUPIVACAINE 1/10 % EPIDURAL INFUSION (WH - ANES)
INTRAMUSCULAR | Status: DC | PRN
  Administered 2012-04-28: 14 mL/h via EPIDURAL

## 2012-04-28 NOTE — Progress Notes (Signed)
Monitors off for epidural procedure. 

## 2012-04-28 NOTE — Progress Notes (Signed)
Tracy Bradshaw is a 36 y.o. G2P0010 at [redacted]w[redacted]d by LMP c/w sono, admitted in early labor since she is not tolerating pain well and has been coming to MAU last 2 nights. She did make cervical change overnight from 1 to 2-3 cm dilatation.  Wants Epidural since she is aware of higher c-section risk due to large baby and high station and hence does not want to bear pain.   Objective: BP 135/76  Pulse 84  Temp 98.3 F (36.8 C) (Oral)  Resp 20  Ht 5\' 2"  (1.575 m)  Wt 168 lb (76.204 kg)  BMI 30.73 kg/m2  LMP 07/28/2011   FHT:  FHR: 140s bpm, variability: moderate,  accelerations:  Present,  decelerations:  Absent UC:   regular, every 3-5 minutes SVE:   Dilation: 3 Effacement (%): 50 Station: -3 Exam by:: Dr Juliene Pina AROM, clear fluid.    Labs: Lab Results  Component Value Date   WBC 15.7* 04/28/2012   HGB 12.3 04/28/2012   HCT 42.0 04/28/2012   MCV 108.2* 04/28/2012   PLT 226 04/28/2012    Assessment / Plan: Early labor at 39.2 wks, s/p AROM. Assess progress. Epidural as desires now.  EFW 8.1/2 lbs Augment with pitocin if needed GBS neg  Circumcision counseling done, consented.  Tracy Bradshaw R 04/28/2012, 3:30 PM

## 2012-04-28 NOTE — H&P (Signed)
Tracy Bradshaw is a 36 y.o. G2P0010 at [redacted]w[redacted]d presenting for labor. Pt notes worsening contractions at 5am today. Pt has been contracting over the past days and has been seen in MAU twice prior over the past few days . Good fetal movement, No vaginal bleeding, not leaking fluid.  [facility] since 1st trimeser - h/o anemia, on iron - DS 135. Failed 1 value at 2 hrs at 161 - GBS neg - LGA baby, 8'3 at 37'5 w/ AC 99% - fetal testing- BPP last night 8/8, MVP 4 cm -5 cm fibroid, fundal   Prenatal Transfer Tool  Maternal Diabetes: No Genetic Screening: Normal AFP only Maternal Ultrasounds/Referrals: Normal Fetal Ultrasounds or other Referrals:  None Maternal Substance Abuse:  No Significant Maternal Medications:  None Significant Maternal Lab Results: None     OB History    Grav Para Term Preterm Abortions TAB SAB Ect Mult Living   2 0   1  1   0     Past Medical History  Diagnosis Date  . SAB (spontaneous abortion)   . Blood type O+   . Fibroid   . Abnormal Pap smear     HPV   Past Surgical History  Procedure Date  . No past surgeries    Family History: family history includes Heart disease in her father and Hypertension in her mother.  There is no history of Other. Social History:  reports that she has never smoked. She has never used smokeless tobacco. She reports that she drinks alcohol. She reports that she does not use illicit drugs.  Review of Systems - Negative except contractions, poor sleep   Dilation: 3 (tight) Effacement (%): 70 Station: -2 Exam by:: Jolynn Blood pressure 131/83, pulse 83, temperature 99.1 F (37.3 C), temperature source Oral, resp. rate 20, last menstrual period 07/28/2011.  Repeat cvx exam 45 min later- good 3cm dilated  Physical Exam:  Gen: well appearing, no distress though uncomfortable and breathing through contractions CV: RRR Pulm: CTAB Back: no CVAT Abd: gravid, NT, no RUQ pain LE: no edema, equal bilaterally,  non-tender Toco: q 5-6 min, lasting 1 min FH: baseline accelerations present, no deceleratons, 10 beat variability  Prenatal labs: ABO, Rh:  O+ Antibody:  neg Rubella:  immune RPR:   NR HBsAg:   neg HIV:   neg GBS:   neg 1 hr Glucola 135  Genetic screening declined, nl AFP Anatomy US nl u/s, LGA   Assessment/Plan: 36 y.o. G2P0010 at [redacted]w[redacted]d Latent phase labor, d/w pt options of d/c home vs theraputic rest vs admission in early labor for monitoring, possible IV pains meds or epidural. Pt chooses latter as has made definite cervical change from prior. Pt aware of possible augmentation, AROM, if she doesn't cont to make change. Pt also aware LGA and possibilities of c/s.  - reactive fetal testing   - fibroid, watch for bleeding  Orphia Mctigue A. 04/28/2012, 12:46 PM

## 2012-04-28 NOTE — Anesthesia Procedure Notes (Signed)
Epidural Patient location during procedure: OB Start time: 04/28/2012 4:16 PM  Staffing Anesthesiologist: Kieffer Blatz A. Performed by: anesthesiologist   Preanesthetic Checklist Completed: patient identified, site marked, surgical consent, pre-op evaluation, timeout performed, IV checked, risks and benefits discussed and monitors and equipment checked  Epidural Patient position: sitting Prep: site prepped and draped and DuraPrep Patient monitoring: continuous pulse ox and blood pressure Approach: midline Injection technique: LOR air  Needle:  Needle type: Tuohy  Needle gauge: 17 G Needle length: 9 cm and 9 Needle insertion depth: 5 cm cm Catheter type: closed end flexible Catheter size: 19 Gauge Catheter at skin depth: 10 cm Test dose: negative and Other  Assessment Events: blood not aspirated, injection not painful, no injection resistance, negative IV test and no paresthesia  Additional Notes Patient identified. Risks and benefits discussed including failed block, incomplete  Pain control, post dural puncture headache, nerve damage, paralysis, blood pressure Changes, nausea, vomiting, reactions to medications-both toxic and allergic and post Partum back pain. All questions were answered. Patient expressed understanding and wished to proceed. Sterile technique was used throughout procedure. Epidural site was Dressed with sterile barrier dressing. No paresthesias, signs of intravascular injection Or signs of intrathecal spread were encountered.  Patient was more comfortable after the epidural was dosed. Please see RN's note for documentation of vital signs and FHR which are stable.

## 2012-04-28 NOTE — MAU Note (Signed)
3rd visit in 36 hours, contractions now q 4 min.  Feeling them in the back.  No bleeding or leaking.

## 2012-04-28 NOTE — Anesthesia Preprocedure Evaluation (Signed)
Anesthesia Evaluation  Patient identified by MRN, date of birth, ID band Patient awake    Reviewed: Allergy & Precautions, H&P , Patient's Chart, lab work & pertinent test results  Airway Mallampati: III TM Distance: >3 FB     Dental No notable dental hx.    Pulmonary neg pulmonary ROS,    Pulmonary exam normal       Cardiovascular negative cardio ROS  Rhythm:regular Rate:Normal     Neuro/Psych negative neurological ROS     GI/Hepatic negative GI ROS, Neg liver ROS, PUD,   Endo/Other  negative endocrine ROS  Renal/GU negative Renal ROS  negative genitourinary   Musculoskeletal   Abdominal Normal abdominal exam  (+)   Peds  Hematology negative hematology ROS (+)   Anesthesia Other Findings   Reproductive/Obstetrics (+) Pregnancy Uterine Fibroids                           Anesthesia Physical Anesthesia Plan  ASA: II  Anesthesia Plan: Epidural   Post-op Pain Management:    Induction:   Airway Management Planned:   Additional Equipment:   Intra-op Plan:   Post-operative Plan:   Informed Consent: I have reviewed the patients History and Physical, chart, labs and discussed the procedure including the risks, benefits and alternatives for the proposed anesthesia with the patient or authorized representative who has indicated his/her understanding and acceptance.   Dental Advisory Given  Plan Discussed with: Anesthesiologist, Surgeon and CRNA  Anesthesia Plan Comments:         Anesthesia Quick Evaluation

## 2012-04-28 NOTE — Progress Notes (Signed)
Pt back from walking and taking shower.   Will proceed to get epidural per pt request then contact Dr Juliene Pina for AROM.

## 2012-04-29 ENCOUNTER — Inpatient Hospital Stay (HOSPITAL_COMMUNITY): Payer: BC Managed Care – PPO | Admitting: Anesthesiology

## 2012-04-29 ENCOUNTER — Encounter (HOSPITAL_COMMUNITY): Payer: Self-pay | Admitting: *Deleted

## 2012-04-29 ENCOUNTER — Encounter (HOSPITAL_COMMUNITY)
Admission: AD | Disposition: A | Discharge status: Home / Self Care | Payer: Self-pay | Source: Ambulatory Visit | Attending: Obstetrics

## 2012-04-29 ENCOUNTER — Encounter (HOSPITAL_COMMUNITY): Payer: Self-pay | Admitting: Anesthesiology

## 2012-04-29 SURGERY — Surgical Case
Anesthesia: Epidural | Wound class: Clean Contaminated

## 2012-04-29 MED ORDER — LACTATED RINGERS IV SOLN
INTRAVENOUS | Status: DC | PRN
  Administered 2012-04-29: 02:00:00 via INTRAVENOUS

## 2012-04-29 MED ORDER — SENNOSIDES-DOCUSATE SODIUM 8.6-50 MG PO TABS
2.0000 | ORAL_TABLET | Freq: Every day | ORAL | Status: DC
  Administered 2012-04-29 – 2012-04-30 (×2): 2 via ORAL

## 2012-04-29 MED ORDER — MORPHINE SULFATE (PF) 0.5 MG/ML IJ SOLN
INTRAMUSCULAR | Status: DC | PRN
  Administered 2012-04-29: 1 mg via INTRAVENOUS

## 2012-04-29 MED ORDER — SCOPOLAMINE 1 MG/3DAYS TD PT72
1.0000 | MEDICATED_PATCH | Freq: Once | TRANSDERMAL | Status: DC
  Administered 2012-04-29: 1.5 mg via TRANSDERMAL

## 2012-04-29 MED ORDER — FENTANYL CITRATE 0.05 MG/ML IJ SOLN
INTRAMUSCULAR | Status: AC
  Filled 2012-04-29: qty 2

## 2012-04-29 MED ORDER — PROMETHAZINE HCL 25 MG/ML IJ SOLN
6.2500 mg | INTRAMUSCULAR | Status: DC | PRN
  Administered 2012-04-29: 12.5 mg via INTRAVENOUS

## 2012-04-29 MED ORDER — ONDANSETRON HCL 4 MG/2ML IJ SOLN: 4.0000 mg | Freq: Three times a day (TID) | INTRAMUSCULAR | Status: DC | PRN

## 2012-04-29 MED ORDER — DIPHENHYDRAMINE HCL 50 MG/ML IJ SOLN: 12.5000 mg | INTRAMUSCULAR | Status: DC | PRN

## 2012-04-29 MED ORDER — LACTATED RINGERS IV SOLN
INTRAVENOUS | Status: DC | PRN
  Administered 2012-04-29 (×2): via INTRAVENOUS

## 2012-04-29 MED ORDER — LIDOCAINE-EPINEPHRINE (PF) 2 %-1:200000 IJ SOLN
INTRAMUSCULAR | Status: AC
  Filled 2012-04-29: qty 20

## 2012-04-29 MED ORDER — ZOLPIDEM TARTRATE 5 MG PO TABS: 5.0000 mg | ORAL_TABLET | Freq: Every evening | ORAL | Status: DC | PRN

## 2012-04-29 MED ORDER — DIBUCAINE 1 % RE OINT: 1.0000 "application " | TOPICAL_OINTMENT | RECTAL | Status: DC | PRN

## 2012-04-29 MED ORDER — SCOPOLAMINE 1 MG/3DAYS TD PT72
MEDICATED_PATCH | TRANSDERMAL | Status: AC
  Administered 2012-04-29: 1.5 mg via TRANSDERMAL
  Filled 2012-04-29: qty 1

## 2012-04-29 MED ORDER — LACTATED RINGERS IV SOLN
INTRAVENOUS | Status: DC
  Administered 2012-04-29: 10:00:00 via INTRAVENOUS

## 2012-04-29 MED ORDER — MORPHINE SULFATE 0.5 MG/ML IJ SOLN
INTRAMUSCULAR | Status: AC
  Filled 2012-04-29: qty 10

## 2012-04-29 MED ORDER — NALBUPHINE HCL 10 MG/ML IJ SOLN
5.0000 mg | INTRAMUSCULAR | Status: DC | PRN
  Filled 2012-04-29: qty 1

## 2012-04-29 MED ORDER — MENTHOL 3 MG MT LOZG: 1.0000 | LOZENGE | OROMUCOSAL | Status: DC | PRN

## 2012-04-29 MED ORDER — ACETAMINOPHEN 10 MG/ML IV SOLN
1000.0000 mg | Freq: Four times a day (QID) | INTRAVENOUS | Status: AC | PRN
  Filled 2012-04-29: qty 100

## 2012-04-29 MED ORDER — LANOLIN HYDROUS EX OINT: 1.0000 "application " | TOPICAL_OINTMENT | CUTANEOUS | Status: DC | PRN

## 2012-04-29 MED ORDER — PROMETHAZINE HCL 25 MG/ML IJ SOLN
INTRAMUSCULAR | Status: AC
  Administered 2012-04-29: 12.5 mg via INTRAVENOUS
  Filled 2012-04-29: qty 1

## 2012-04-29 MED ORDER — SODIUM BICARBONATE 8.4 % IV SOLN
INTRAVENOUS | Status: AC
  Filled 2012-04-29: qty 50

## 2012-04-29 MED ORDER — CEFAZOLIN SODIUM-DEXTROSE 2-3 GM-% IV SOLR: 2.0000 g | INTRAVENOUS | Status: DC

## 2012-04-29 MED ORDER — KETOROLAC TROMETHAMINE 30 MG/ML IJ SOLN
30.0000 mg | Freq: Four times a day (QID) | INTRAMUSCULAR | Status: DC | PRN
  Administered 2012-04-29 (×2): 30 mg via INTRAMUSCULAR

## 2012-04-29 MED ORDER — CEFAZOLIN SODIUM-DEXTROSE 2-3 GM-% IV SOLR
INTRAVENOUS | Status: AC
Start: 2012-04-29 — End: 2012-04-29
  Filled 2012-04-29: qty 50

## 2012-04-29 MED ORDER — DIPHENHYDRAMINE HCL 50 MG/ML IJ SOLN: 25.0000 mg | INTRAMUSCULAR | Status: DC | PRN

## 2012-04-29 MED ORDER — IBUPROFEN 600 MG PO TABS
600.0000 mg | ORAL_TABLET | Freq: Four times a day (QID) | ORAL | Status: DC
  Administered 2012-04-29 – 2012-05-02 (×13): 600 mg via ORAL
  Filled 2012-04-29 (×13): qty 1

## 2012-04-29 MED ORDER — OXYTOCIN 10 UNIT/ML IJ SOLN
40.0000 [IU] | INTRAVENOUS | Status: DC | PRN
  Administered 2012-04-29: 40 [IU] via INTRAVENOUS

## 2012-04-29 MED ORDER — ONDANSETRON HCL 4 MG/2ML IJ SOLN: 4.0000 mg | INTRAMUSCULAR | Status: DC | PRN

## 2012-04-29 MED ORDER — OXYCODONE-ACETAMINOPHEN 5-325 MG PO TABS
1.0000 | ORAL_TABLET | ORAL | Status: DC | PRN
  Administered 2012-04-29: 1 via ORAL
  Filled 2012-04-29: qty 1

## 2012-04-29 MED ORDER — NALOXONE HCL 0.4 MG/ML IJ SOLN: 0.4000 mg | INTRAMUSCULAR | Status: DC | PRN

## 2012-04-29 MED ORDER — MIDAZOLAM HCL 2 MG/2ML IJ SOLN: 0.5000 mg | Freq: Once | INTRAMUSCULAR | Status: DC | PRN

## 2012-04-29 MED ORDER — SIMETHICONE 80 MG PO CHEW: 80.0000 mg | CHEWABLE_TABLET | ORAL | Status: DC | PRN

## 2012-04-29 MED ORDER — SODIUM CHLORIDE 0.9 % IJ SOLN: 3.0000 mL | INTRAMUSCULAR | Status: DC | PRN

## 2012-04-29 MED ORDER — OXYTOCIN 40 UNITS IN LACTATED RINGERS INFUSION - SIMPLE MED: 62.5000 mL/h | INTRAVENOUS | Status: AC

## 2012-04-29 MED ORDER — KETOROLAC TROMETHAMINE 30 MG/ML IJ SOLN
INTRAMUSCULAR | Status: AC
  Administered 2012-04-29: 30 mg via INTRAMUSCULAR
  Filled 2012-04-29: qty 1

## 2012-04-29 MED ORDER — DIPHENHYDRAMINE HCL 25 MG PO CAPS: 25.0000 mg | ORAL_CAPSULE | ORAL | Status: DC | PRN

## 2012-04-29 MED ORDER — MEPERIDINE HCL 25 MG/ML IJ SOLN: 6.2500 mg | INTRAMUSCULAR | Status: DC | PRN

## 2012-04-29 MED ORDER — FENTANYL CITRATE 0.05 MG/ML IJ SOLN
INTRAMUSCULAR | Status: DC | PRN
  Administered 2012-04-29 (×2): 25 ug via INTRAVENOUS

## 2012-04-29 MED ORDER — KETOROLAC TROMETHAMINE 30 MG/ML IJ SOLN: 30.0000 mg | Freq: Four times a day (QID) | INTRAMUSCULAR | Status: DC | PRN

## 2012-04-29 MED ORDER — ONDANSETRON HCL 4 MG PO TABS: 4.0000 mg | ORAL_TABLET | ORAL | Status: DC | PRN

## 2012-04-29 MED ORDER — METOCLOPRAMIDE HCL 5 MG/ML IJ SOLN
10.0000 mg | Freq: Three times a day (TID) | INTRAMUSCULAR | Status: DC | PRN
  Administered 2012-04-29: 10 mg via INTRAVENOUS

## 2012-04-29 MED ORDER — NALOXONE HCL 1 MG/ML IJ SOLN
1.0000 ug/kg/h | INTRAVENOUS | Status: DC | PRN
  Filled 2012-04-29: qty 2

## 2012-04-29 MED ORDER — CEFAZOLIN SODIUM-DEXTROSE 2-3 GM-% IV SOLR
INTRAVENOUS | Status: DC | PRN
  Administered 2012-04-29: 2 g via INTRAVENOUS

## 2012-04-29 MED ORDER — ONDANSETRON HCL 4 MG/2ML IJ SOLN
INTRAMUSCULAR | Status: DC | PRN
  Administered 2012-04-29: 4 mg via INTRAVENOUS

## 2012-04-29 MED ORDER — METOCLOPRAMIDE HCL 5 MG/ML IJ SOLN
INTRAMUSCULAR | Status: AC
  Administered 2012-04-29: 10 mg via INTRAVENOUS
  Filled 2012-04-29: qty 2

## 2012-04-29 MED ORDER — ONDANSETRON HCL 4 MG/2ML IJ SOLN
INTRAMUSCULAR | Status: AC
Start: 2012-04-29 — End: 2012-04-29
  Filled 2012-04-29: qty 2

## 2012-04-29 MED ORDER — WITCH HAZEL-GLYCERIN EX PADS: 1.0000 "application " | MEDICATED_PAD | CUTANEOUS | Status: DC | PRN

## 2012-04-29 MED ORDER — FENTANYL CITRATE 0.05 MG/ML IJ SOLN
INTRAMUSCULAR | Status: DC | PRN
  Administered 2012-04-29: 50 ug via INTRAVENOUS

## 2012-04-29 MED ORDER — MORPHINE SULFATE (PF) 0.5 MG/ML IJ SOLN
INTRAMUSCULAR | Status: DC | PRN
  Administered 2012-04-29: 4 mg via EPIDURAL

## 2012-04-29 MED ORDER — SIMETHICONE 80 MG PO CHEW
80.0000 mg | CHEWABLE_TABLET | Freq: Three times a day (TID) | ORAL | Status: DC
  Administered 2012-04-29 – 2012-05-01 (×11): 80 mg via ORAL

## 2012-04-29 MED ORDER — FENTANYL CITRATE 0.05 MG/ML IJ SOLN: 25.0000 ug | INTRAMUSCULAR | Status: DC | PRN

## 2012-04-29 MED ORDER — SODIUM BICARBONATE 8.4 % IV SOLN
INTRAVENOUS | Status: DC | PRN
  Administered 2012-04-29 (×2): 5 mL via EPIDURAL

## 2012-04-29 MED ORDER — PRENATAL MULTIVITAMIN CH
1.0000 | ORAL_TABLET | Freq: Every day | ORAL | Status: DC
  Administered 2012-04-29 – 2012-05-02 (×4): 1 via ORAL
  Filled 2012-04-29 (×4): qty 1

## 2012-04-29 MED ORDER — DIPHENHYDRAMINE HCL 25 MG PO CAPS: 25.0000 mg | ORAL_CAPSULE | Freq: Four times a day (QID) | ORAL | Status: DC | PRN

## 2012-04-29 SURGICAL SUPPLY — 40 items
BENZOIN TINCTURE PRP APPL 2/3 (GAUZE/BANDAGES/DRESSINGS) ×2 IMPLANT
CLOTH BEACON ORANGE TIMEOUT ST (SAFETY) ×2 IMPLANT
CONTAINER PREFILL 10% NBF 15ML (MISCELLANEOUS) IMPLANT
DRESSING TELFA 8X3 (GAUZE/BANDAGES/DRESSINGS) ×2 IMPLANT
DRSG COVADERM 4X10 (GAUZE/BANDAGES/DRESSINGS) IMPLANT
DURAPREP 26ML APPLICATOR (WOUND CARE) ×2 IMPLANT
ELECT REM PT RETURN 9FT ADLT (ELECTROSURGICAL) ×2
ELECTRODE REM PT RTRN 9FT ADLT (ELECTROSURGICAL) ×1 IMPLANT
EXTRACTOR VACUUM KIWI (MISCELLANEOUS) IMPLANT
EXTRACTOR VACUUM M CUP 4 TUBE (SUCTIONS) IMPLANT
GAUZE SPONGE 4X4 12PLY STRL LF (GAUZE/BANDAGES/DRESSINGS) ×4 IMPLANT
GLOVE BIO SURGEON STRL SZ7 (GLOVE) ×2 IMPLANT
GLOVE BIOGEL PI IND STRL 7.0 (GLOVE) ×2 IMPLANT
GLOVE BIOGEL PI INDICATOR 7.0 (GLOVE) ×2
GOWN PREVENTION PLUS LG XLONG (DISPOSABLE) ×6 IMPLANT
KIT ABG SYR 3ML LUER SLIP (SYRINGE) IMPLANT
NEEDLE HYPO 25X5/8 SAFETYGLIDE (NEEDLE) IMPLANT
NS IRRIG 1000ML POUR BTL (IV SOLUTION) ×2 IMPLANT
PACK C SECTION WH (CUSTOM PROCEDURE TRAY) ×2 IMPLANT
PAD ABD 7.5X8 STRL (GAUZE/BANDAGES/DRESSINGS) ×2 IMPLANT
PAD OB MATERNITY 4.3X12.25 (PERSONAL CARE ITEMS) IMPLANT
RTRCTR C-SECT PINK 25CM LRG (MISCELLANEOUS) ×2 IMPLANT
SLEEVE SCD COMPRESS KNEE MED (MISCELLANEOUS) IMPLANT
SPONGE LAP 18X18 X RAY DECT (DISPOSABLE) ×2 IMPLANT
STAPLER VISISTAT 35W (STAPLE) IMPLANT
STRIP CLOSURE SKIN 1/4X4 (GAUZE/BANDAGES/DRESSINGS) ×2 IMPLANT
SUT MNCRL 0 VIOLET CTX 36 (SUTURE) ×3 IMPLANT
SUT MONOCRYL 0 CTX 36 (SUTURE) ×3
SUT PLAIN 0 NONE (SUTURE) IMPLANT
SUT PLAIN 2 0 (SUTURE)
SUT PLAIN ABS 2-0 CT1 27XMFL (SUTURE) IMPLANT
SUT VIC AB 0 CT1 27 (SUTURE) ×2
SUT VIC AB 0 CT1 27XBRD ANBCTR (SUTURE) ×2 IMPLANT
SUT VIC AB 2-0 CT1 27 (SUTURE) ×2
SUT VIC AB 2-0 CT1 TAPERPNT 27 (SUTURE) ×2 IMPLANT
SUT VIC AB 4-0 KS 27 (SUTURE) IMPLANT
SUT VICRYL 0 TIES 12 18 (SUTURE) IMPLANT
TOWEL OR 17X24 6PK STRL BLUE (TOWEL DISPOSABLE) ×4 IMPLANT
TRAY FOLEY CATH 14FR (SET/KITS/TRAYS/PACK) IMPLANT
WATER STERILE IRR 1000ML POUR (IV SOLUTION) ×2 IMPLANT

## 2012-04-29 NOTE — Progress Notes (Signed)
Patient ID: Tracy Bradshaw, female   DOB: 1976/05/14, 36 y.o.   MRN: [id number] Subjective: Doing well with some pressure/pain on left since epidural rate was reduced.    Objective: BP 126/73  Pulse 75  Temp 98.6 F (37 C) (Oral)  Resp 18  Ht 5\' 2"  (1.575 m)  Wt 168 lb (76.204 kg)  BMI 30.73 kg/m2  LMP 07/28/2011   FHT:  FHR: 150s bpm, variability: minimal ,  accelerations:  Abscent,  decelerations:  Present ovvasional variable decels UC:   regular, every 2-3 minutes, 175 MVU SVE:   Dilation: 5.5 Effacement (%): 80 Station: -3 Exam by:: Jermichael Belmares Unchanged since 9 pm   Assessment / Plan: Arrest of dilatation and no descent. Proceed with c-section.  Risks/complications of surgery reviewed incl infection, bleeding, damage to internal organs including bladder, bowels, ureters, blood vessels, other risks from anesthesia, VTE and delayed complications of any surgery, complications in future surgery reviewed. Also discussed neonatal complications incl difficult delivery, laceration, vacuum assistance, TTN etc. Pt understands and agrees, all concerns addressed.    Jameel Quant R 04/29/2012, 1:22 AM

## 2012-04-29 NOTE — Progress Notes (Signed)
Tracy Bradshaw is a 37 y.o. G2P0010 at [redacted]w[redacted]d admitted in early labor. On pitocin with slow progress.   Objective: BP 108/54  Pulse 86  Temp 98.6 F (37 C) (Oral)  Resp 18  Ht 5\' 2"  (1.575 m)  Wt 168 lb (76.204 kg)  BMI 30.73 kg/m2  LMP 07/28/2011   FHT:  FHR: 150s bpm, variability: minimal ,  accelerations:  Present,  decelerations:  Present late decels have resolved UC:   regular, every 3-4 minutes SVE:   Dilation: 5.5 Effacement (%): 80 Station: -3 Exam by:: Tracy Bradshaw Caput noted. There is no progress in dilatation since last 3 hrs and no progress in descent since labor admission.   Assessment / Plan: Protracted active phase possible arrest. Continue pitocin, IUPC placed.  Reassess labor adequacy and progress in next hour. FHT was category II but with IV bolus has improved to I-II at times.   Tracy Bradshaw R 04/29/2012, 12:17 AM

## 2012-04-29 NOTE — Transfer of Care (Signed)
Immediate Anesthesia Transfer of Care Note  Patient: Tracy Bradshaw  Procedure(s) Performed: Procedure(s) (LRB) with comments: CESAREAN SECTION (N/A)  Patient Location: PACU  Anesthesia Type:Epidural  Level of Consciousness: awake, alert  and oriented  Airway & Oxygen Therapy: Patient Spontanous Breathing  Post-op Assessment: Report given to PACU RN and Post -op Vital signs reviewed and stable  Post vital signs: Reviewed and stable  Complications: No apparent anesthesia complications

## 2012-04-29 NOTE — Op Note (Signed)
Primary Low Transverse Cesarean Section Procedure Note 04/29/2012   Tracy Bradshaw  Indications: Arrest of dilatation and descent   Pre-operative Diagnosis: Failure to Progress in stage I.   Post-operative Diagnosis: Same   Surgeon:  Robley Fries, MD  Assistants: none  Anesthesia: epidural   Procedure Details:  The patient was evaluated in Labor room. She was at 5 cm for over 4 hrs with no descent below -3. Cesarean delivery was recommended. The risks, benefits, complications, treatment options, and expected outcomes were discussed with the patient. The patient concurred with the proposed plan, giving informed consent. identified as Tracy Bradshaw and the procedure verified as C-Section Delivery.  She was brought to the Operating Room. A Time Out was held and the above information confirmed. She received 2 gm Ancef.  After induction of anesthesia, the patient was draped and prepped in the usual sterile manner. A Pfannenstiel Incision was made and carried down through the subcutaneous tissue to the fascia. Fascial incision was made and extended transversely. The fascia was separated from the underlying rectus tissue superiorly and inferiorly. The peritoneum was identified and entered. Peritoneal incision was extended longitudinally. Alexis-O retractor was carefully placed. Lower segment was bulging and head was palpable through it. A low transverse uterine incision was made watching bladder reflection carefully. Delivered from cephalic presentation at 2.09 am was a healthy baby BOY with Apgars 8 and 9 at 1 and 5 minutes. Mouth was suctioned with bulb, cord clamped and cut and baby was handed to waiting NICU team. Umbilical cord was short, cord blood was collected but cord ph was not sent. Uterus was noted to be atonic and with massage and pitocin it improved. The placenta was removed Intact and appeared normal with 3 vessel cord. Uterine cavity was cleaned with clean lap and pieces of membranes removed.  The uterine outline, tubes and ovaries appeared normal. The uterine incision was closed with running locked sutures of 0Vicryl followed by a second imbricating layer.  Hemostasis was observed. Lavage was carried out until clear. Alexis-O retractor was removed. Peritoneum was closed with 2-0 Vicryl. Pyramidalis approximated in midline. The fascia was then reapproximated with running sutures of 0Vicryl. The subcuticular closure was performed using 3-0Vicryl. Steristrips applied followed by sterile pressure dressing.  Instrument, sponge, and needle counts were correct prior the abdominal closure and were correct at the conclusion of the case.   Findings: Baby BOY noted in ROT position with no descent and some caput. Delivered at 2.09 am on 04/29/12 with APGARs of 8 and 9 at 1 and 5 min. Normal tubes and ovaries. Placenta normal with 3 vessel cord. Baby weight is pending.    Estimated Blood Loss: 800 cc  Total IV Fluids: 2000 ml   Urine Output: 300CC OF clear urine  Specimens: @ORSPECIMEN @   Complications: no complications  Disposition: PACU - hemodynamically stable.   Maternal Condition: stable   Baby condition / location:  with mother  Attending Attestation: I performed the procedure.   Signed: Surgeon(s): Robley Fries, MD

## 2012-04-29 NOTE — Anesthesia Postprocedure Evaluation (Signed)
  Anesthesia Post-op Note  Patient: Tracy Bradshaw  Procedure(s) Performed: Procedure(s) (LRB) with comments: CESAREAN SECTION (N/A)  Patient Location: Mother/Baby  Anesthesia Type:Epidural  Level of Consciousness: awake, alert  and oriented  Airway and Oxygen Therapy: Patient Spontanous Breathing  Post-op Pain: mild  Post-op Assessment: Patient's Cardiovascular Status Stable, Respiratory Function Stable, Patent Airway, No signs of Nausea or vomiting and Pain level controlled  Post-op Vital Signs: stable  Complications: No apparent anesthesia complications

## 2012-04-30 ENCOUNTER — Encounter (HOSPITAL_COMMUNITY): Payer: Self-pay

## 2012-04-30 DIAGNOSIS — D62 Acute posthemorrhagic anemia: Secondary | ICD-10-CM

## 2012-04-30 HISTORY — DX: Acute posthemorrhagic anemia: D62

## 2012-04-30 LAB — CBC
MCH: 30.7 pg (ref 26.0–34.0)
MCHC: 32.2 g/dL (ref 30.0–36.0)
MCV: 95.2 fL (ref 78.0–100.0)
Platelets: 206 10*3/uL (ref 150–400)
RDW: 13.6 % (ref 11.5–15.5)

## 2012-04-30 NOTE — Progress Notes (Signed)
Subjective: POD# 1 Information for the patient's newborn:  Tracy Bradshaw, Tracy Bradshaw [[id number]]  female  / circ planned today  Reports feeling well. Feeding: breast Patient reports tolerating PO.  Breast symptoms: denies discomfort. Pain controlled with Motrin and Percocet. Denies HA/SOB/C/P/N/V/dizziness. Flatus present. She reports vaginal bleeding as normal, without clots.  She is ambulating, urinating without difficult.     Objective:   VS:  Filed Vitals:   04/29/12 1730 04/29/12 2117 04/30/12 0120 04/30/12 0535  BP: 105/68 106/71 108/69 99/65  Pulse: 81 89 95 73  Temp: 98.6 F (37 C) 98.6 F (37 C) 99 F (37.2 C) 97.3 F (36.3 C)  TempSrc: Axillary Oral Oral Oral  Resp: 20 18 18 18   Height:      Weight:      SpO2: 96% 96% 97%      Intake/Output Summary (Last 24 hours) at 04/30/12 0918 Last data filed at 04/30/12 0537  Gross per 24 hour  Intake   2506 ml  Output   2100 ml  Net    406 ml        Basename 04/30/12 0540 04/28/12 1330  WBC 18.1* 15.7*  HGB 8.9* 12.3  HCT 27.6* 42.0  PLT 206 226     Blood type: --/--/O POS, O POS (11/29 1330)  Rubella: Immune (05/30 0000)     Physical Exam:  General: alert, cooperative and no distress CV: Regular rate and rhythm Resp: clear Abdomen: soft, nontender, hypoactive bowel sounds Incision: clean, dry, intact and dressing in place. Uterine Fundus: firm, below umbilicus, nontender Lochia: minimal Ext: edema +1 pedal and Homans sign is negative, no sign of DVT      Assessment/Plan: 36 y.o.   POD# 1. [id number]                  Active Problems:  Uterine fibroid  Cesarean delivery delivered (arrest of descent - 11/30)  Acute blood loss anemia  Asymptomatic. Will start oral Fe supplement at discharge. Doing well, stable.               Advance diet as tolerated Ambulate, warm PO fluids LC consult for BFing / latch Routine post-op care  Tracy Bradshaw 04/30/2012, 9:18 AM

## 2012-05-01 ENCOUNTER — Encounter (HOSPITAL_COMMUNITY): Payer: Self-pay | Admitting: Obstetrics & Gynecology

## 2012-05-01 MED ORDER — POLYSACCHARIDE IRON COMPLEX 150 MG PO CAPS
150.0000 mg | ORAL_CAPSULE | Freq: Every day | ORAL | Status: DC
  Filled 2012-05-01: qty 1

## 2012-05-01 MED ORDER — DOCUSATE SODIUM 100 MG PO CAPS
100.0000 mg | ORAL_CAPSULE | Freq: Two times a day (BID) | ORAL | Status: DC
  Administered 2012-05-01 – 2012-05-02 (×3): 100 mg via ORAL
  Filled 2012-05-01 (×3): qty 1

## 2012-05-01 NOTE — Progress Notes (Signed)
POSTOPERATIVE DAY # 2 S/P cesarean section   S:         Reports feeling ok - really tired and little "heavy in lower abdomen"             Tolerating po intake / no nausea / no vomiting / + flatus / no BM             Bleeding is light             Pain controlled with motrin and percocet             Up ad lib / ambulatory/ voiding QS  Newborn breast feeding  / Circumcision done   O:  VS: BP 114/70  Pulse 77  Temp 98.2 F (36.8 C) (Oral)  Resp 17  Ht 5\' 2"  (1.575 m)  Wt 76.204 kg (168 lb)  BMI 30.73 kg/m2  SpO2 98%  LMP 07/28/2011  Breastfeeding? Unknown   LABS:  Basename 04/30/12 0540 04/28/12 1330  WBC 18.1* 15.7*  HGB 8.9* 12.3  PLT 206 226                           I&O:                          I/O last 3 completed shifts: In: 480 [P.O.:480] Out: 500 [Urine:500]             Physical Exam:             Alert and Oriented X3  Lungs: Clear and unlabored  Heart: regular rate and rhythm / no mumurs  Abdomen: soft, non-tender, non-distended,active BS             Fundus: firm, non-tender, U-1             Dressing OFF              Incision:  approximated with subcuticular with steristrips / no erythema / no ecchymosis / no drainage  Perineum: no edema  Lochia: light  Extremities: trace edema, no calf pain or tenderness, negative Homans  A:        POD # 2 S/P cesarean section            ABL anemia  P:        Routine postoperative care              Start iron and colace today             Anticipate discharge tomorrow             Ambulate and push fluids / especially warm fluids to promote bowel motility    Marlinda Mike CNM, MSN 05/01/2012, 9:23 AM

## 2012-05-02 ENCOUNTER — Encounter (HOSPITAL_COMMUNITY): Payer: Self-pay | Admitting: Obstetrics and Gynecology

## 2012-05-02 ENCOUNTER — Encounter (HOSPITAL_COMMUNITY)
Admit: 2012-05-02 | Discharge: 2012-05-02 | Disposition: A | Discharge status: Home / Self Care | Payer: BC Managed Care – PPO | Attending: Obstetrics | Admitting: Obstetrics

## 2012-05-02 DIAGNOSIS — O9902 Anemia complicating childbirth: Secondary | ICD-10-CM | POA: Diagnosis not present

## 2012-05-02 LAB — TYPE AND SCREEN
ABO/RH(D): O POS
Unit division: 0

## 2012-05-02 MED ORDER — DSS 100 MG PO CAPS: 100.0000 mg | ORAL_CAPSULE | Freq: Two times a day (BID) | ORAL | Status: DC

## 2012-05-02 MED ORDER — SIMETHICONE 80 MG PO CHEW: 80.0000 mg | CHEWABLE_TABLET | Freq: Four times a day (QID) | ORAL | Status: DC | PRN

## 2012-05-02 MED ORDER — POLYSACCHARIDE IRON COMPLEX 150 MG PO CAPS: 150.0000 mg | ORAL_CAPSULE | Freq: Every day | ORAL | Status: DC

## 2012-05-02 MED ORDER — IBUPROFEN 600 MG PO TABS: 600.0000 mg | ORAL_TABLET | Freq: Four times a day (QID) | ORAL | Status: DC | PRN

## 2012-05-02 MED ORDER — OXYCODONE-ACETAMINOPHEN 5-325 MG PO TABS
1.0000 | ORAL_TABLET | ORAL | Status: DC | PRN
Start: 2012-05-02 — End: 2014-07-29

## 2012-05-02 NOTE — Discharge Summary (Signed)
Physician Discharge Summary  Patient ID: Tracy Bradshaw MRN: [id number] DOB/AGE: 01-26-1976 36 y.o.  Admit date: 04/28/2012 Discharge date: 05/02/2012  Admission Diagnoses: admitted in active labor at [redacted]w[redacted]d   Discharge Diagnoses:  Active Problems:  Uterine fibroid  Cesarean delivery delivered (arrest of descent - 11/30)  Acute blood loss anemia  Anemia of mother in pregnancy, delivered   Discharged Condition: good  Hospital Course: arrest of descent and PLTCS  Consults: None  Significant Diagnostic Studies: labs: routine antenatal, microbiology: urine culture: negative and radiology: Ultrasound: anatomy normal with f/u USS for growth in 3rd Trimester  Treatments: IV hydration, antibiotics: Ancef and analgesia: acetaminophen w/ codeine and Ibuprofen  Discharge Exam: Blood pressure 116/71, pulse 71, temperature 97.1 F (36.2 C), temperature source Oral, resp. rate 18, height 5\' 2"  (1.575 m), weight 76.204 kg (168 lb), last menstrual period 07/28/2011, SpO2 98.00%, unknown if currently breastfeeding.  ROS: General appearance: alert, cooperative and no distress Alert and AAO x 3 Lungs: CTAB CV: RRR Breasts: N/T Abdomen: soft N/T Uterus: -2/u, firm Incision: D & I, sub Q suture and steri - strips in place. Lochia: Min, Rubra GI: BM and flatus GU: no problems voiding Extremities: +1 edema Bilaterally - feet only - advise re e;evation and to drink water to assist with decreasing swelling.    Disposition: 01-Home or Self Care  Discharge Orders    Future Orders Please Complete By Expires   Diet general      Discharge instructions      Comments:   Per [facility] Booklet       Medication List     As of 05/02/2012 11:40 AM    STOP taking these medications         IRON PO      TAKE these medications         DSS 100 MG Caps   Take 100 mg by mouth 2 (two) times daily.      ibuprofen 600 MG tablet   Commonly known as: ADVIL,MOTRIN   Take 1 tablet (600 mg total) by  mouth every 6 (six) hours as needed for pain.      iron polysaccharides 150 MG capsule   Commonly known as: NIFEREX   Take 1 capsule (150 mg total) by mouth daily with breakfast.      oxyCODONE-acetaminophen 5-325 MG per tablet   Commonly known as: PERCOCET/ROXICET   Take 1-2 tablets by mouth every 4 (four) hours as needed (moderate - severe pain).      prenatal multivitamin Tabs   Take 1 tablet by mouth every evening.      simethicone 80 MG chewable tablet   Commonly known as: MYLICON   Chew 1 tablet (80 mg total) by mouth every 6 (six) hours as needed for flatulence.           Follow-up Information    Follow up with [facility] Schedule an appointment as soon as possible for a visit in 10 days. (As needed)    Contact information:   [address] [place] [address] [phone]         Signed: Earl Gala, CNM 05/02/2012, 11:40 AM

## 2012-05-02 NOTE — Progress Notes (Signed)
Subjective: Postpartum Day 3: Cesarean Delivery secondary to:  Patient reports tolerating PO, + BM and no problems voiding.  +BM no complaints, up ad lib without syncope Pain well controlled with po meds, using motrin and percocet  BF : Breastfeeding on demand - coping well. Mood stable, bonding well  Objective: Vital signs in last 24 hours: Temp:  [97.1 F (36.2 C)-98.6 F (37 C)] 97.1 F (36.2 C) (12/03 0520) Pulse Rate:  [71-78] 71  (12/03 0520) Resp:  [18-20] 18  (12/03 0520) BP: (115-117)/(64-71) 116/71 mmHg (12/03 0520) SpO2:  [98 %] 98 % (12/02 1530)  Physical Exam:  General: alert, cooperative and no distress Breasts: N/T - requested nipple cream. Heart: RRR Lungs: CTAB Abdomen: BS x4 Uterine Fundus: firm Incision: healing well, no significant drainage, no dehiscence, no significant erythema  JP drain:  Lochia: appropriate DVT Evaluation: No evidence of DVT seen on physical exam. Negative Homan's sign. No cords or calf tenderness. No significant calf/ankle edema. +1 edema bilaterally to ankles, advised lower limb elevation and drink plenty of water.   Basename 04/30/12 0540  HGB 8.9*  HCT 27.6*   Anemia associated with pregnancy To commence on Niferex 150 mg po daily Baby has had Circumcision while in hospital.  Assessment/Plan: Status post Cesarean section. Doing well postoperatively.  Discharge home with standard precautions and return to clinic in 4-6 weeks.   Bhavik Cabiness, CNM. 05/02/2012, 10:31 AM

## 2012-05-04 NOTE — Progress Notes (Signed)
Post discharge chart review completed.  

## 2012-05-05 ENCOUNTER — Inpatient Hospital Stay (HOSPITAL_COMMUNITY): Admission: RE | Admit: 2012-05-05 | Payer: BC Managed Care – PPO | Source: Ambulatory Visit

## 2012-06-02 ENCOUNTER — Encounter (HOSPITAL_COMMUNITY)
Admission: RE | Admit: 2012-06-02 | Discharge: 2012-06-02 | Disposition: A | Discharge status: Home / Self Care | Payer: Self-pay | Source: Ambulatory Visit | Attending: Obstetrics | Admitting: Obstetrics

## 2012-06-02 DIAGNOSIS — O923 Agalactia: Secondary | ICD-10-CM | POA: Insufficient documentation

## 2013-03-16 IMAGING — US US FETAL BPP W/O NONSTRESS
1 series · 9 of 9 positions shown · non-contrast
Comparison: none

CLINICAL DATA: 39 weeks pregnant; non-reactive nonstress test.

[Series 1: us fetal bpp w/o nonstress · non-contrast · 9 acquisitions, 9 frames shown]
[im 1/9]
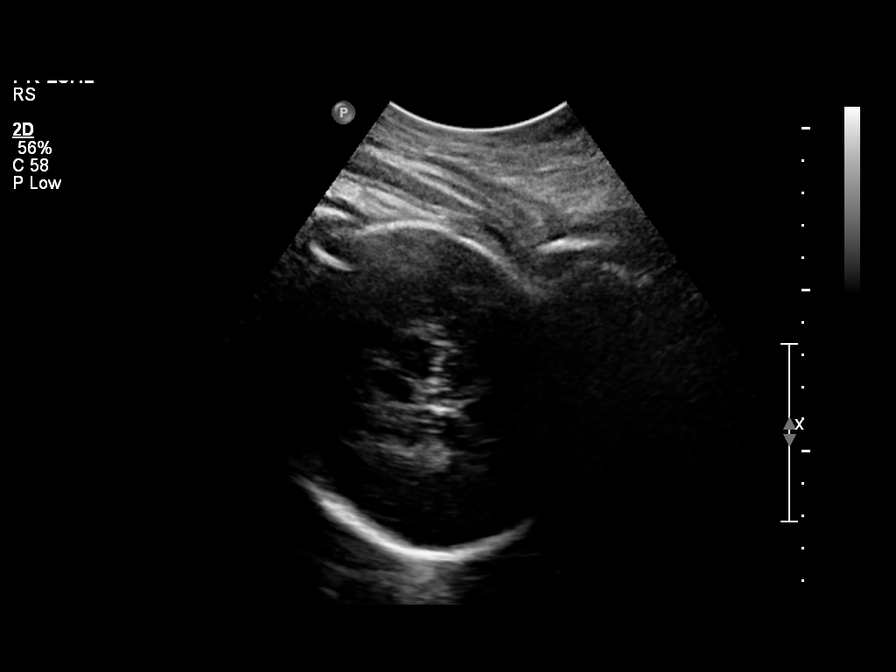
[im 2/9]
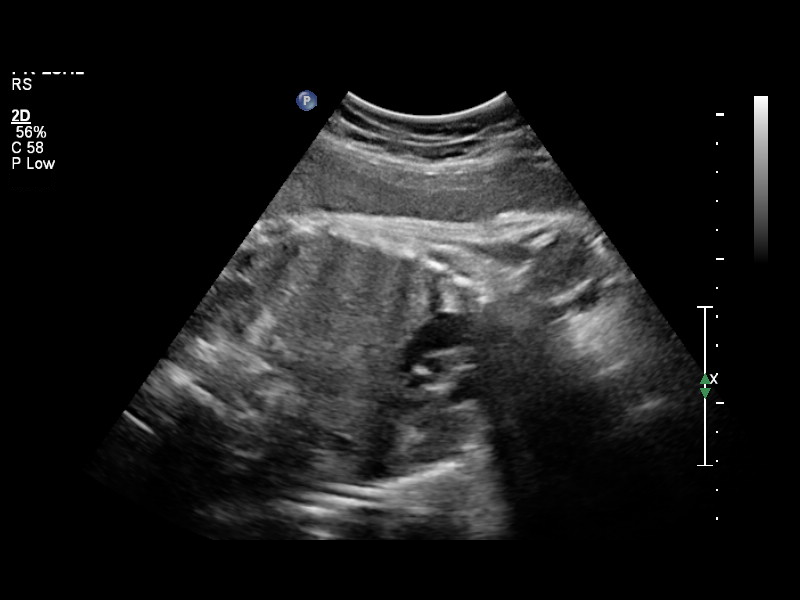
[im 3/9]
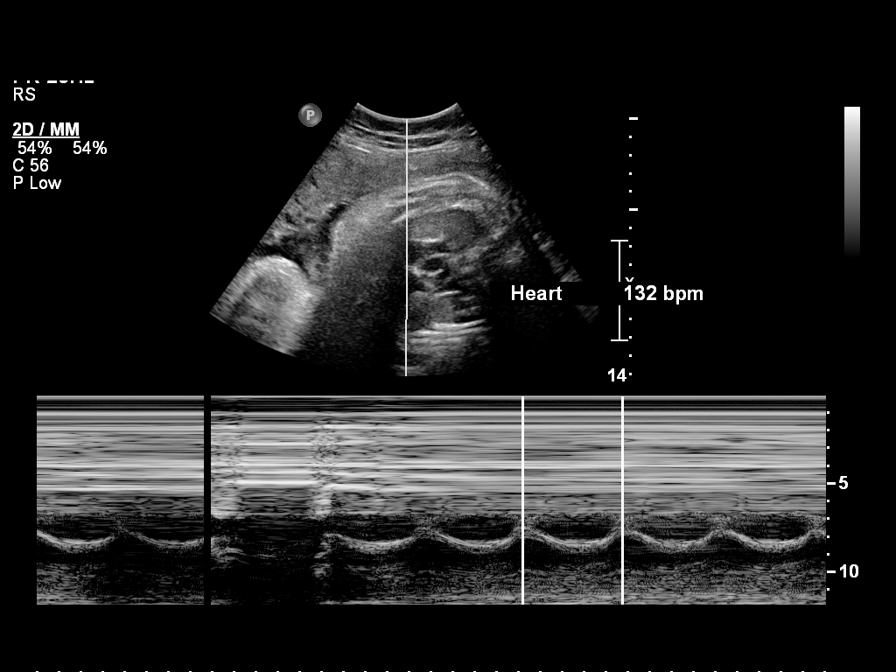
[im 4/9]
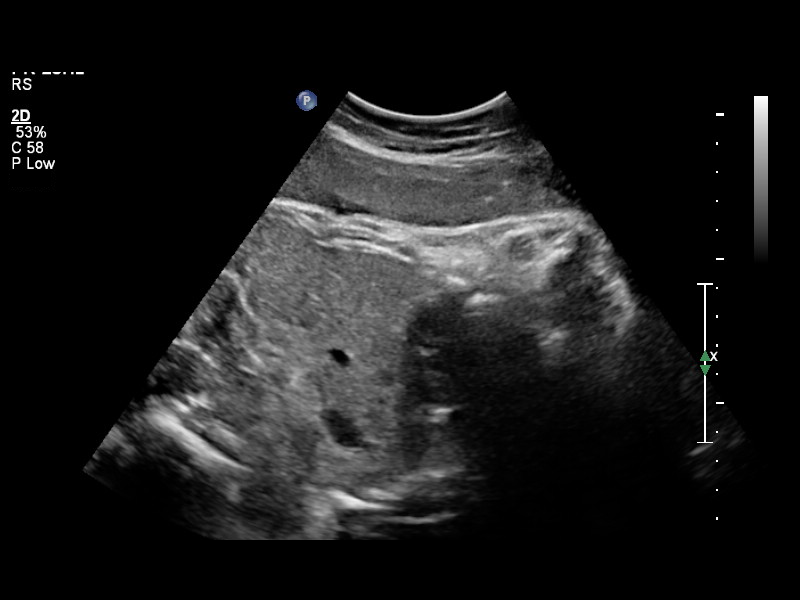
[im 5/9]
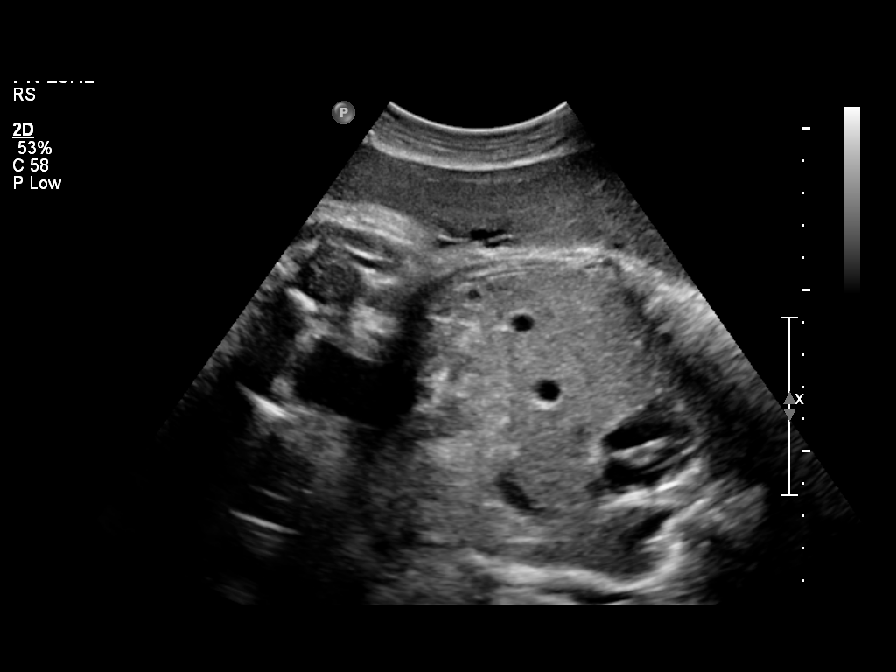
[im 6/9]
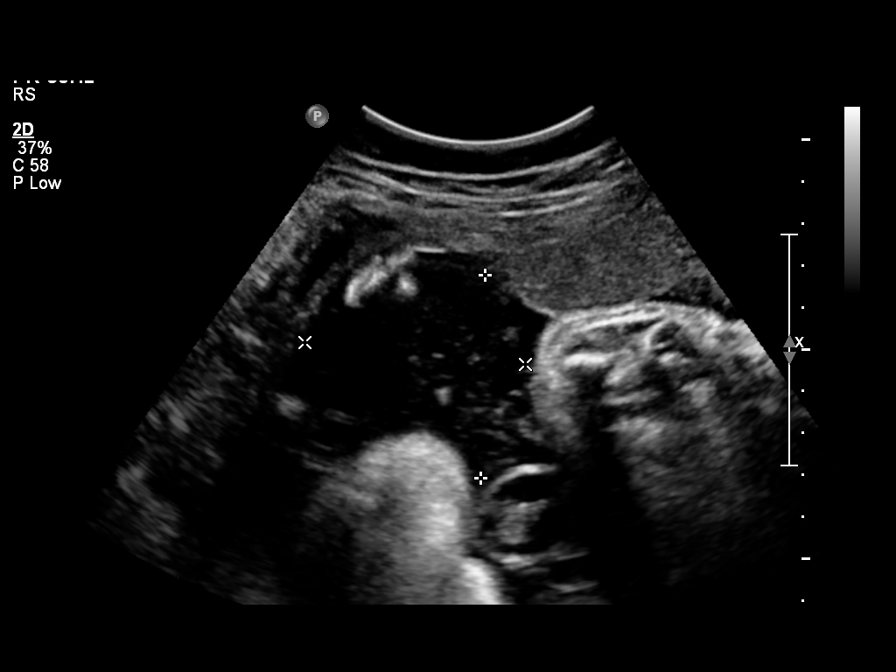
[im 7/9]
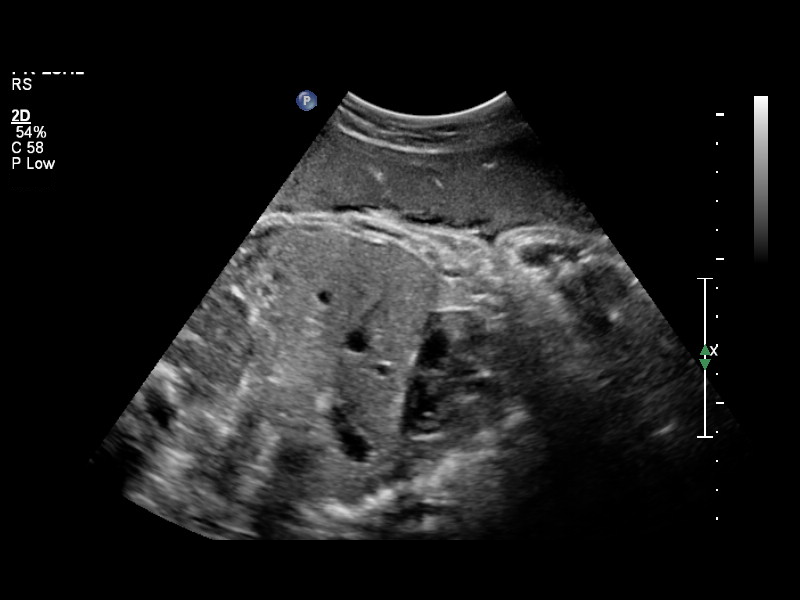
[im 8/9]
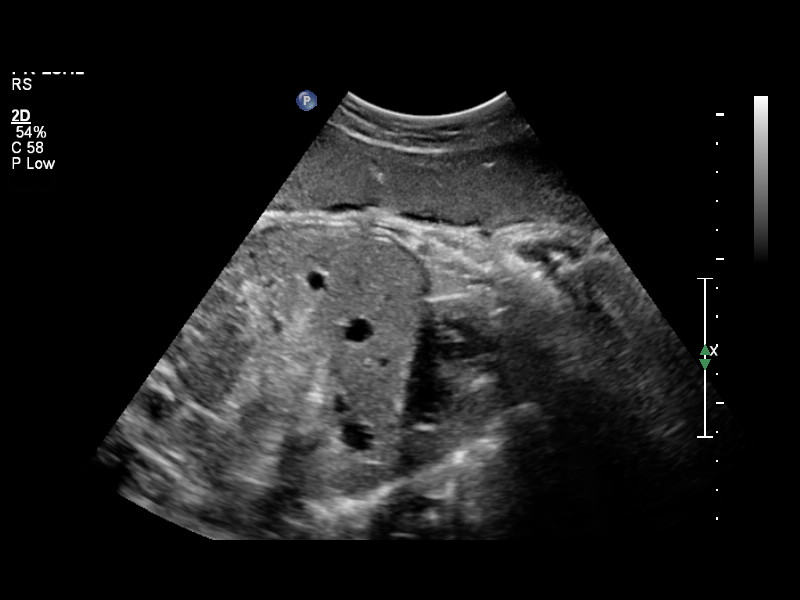
[im 9/9]
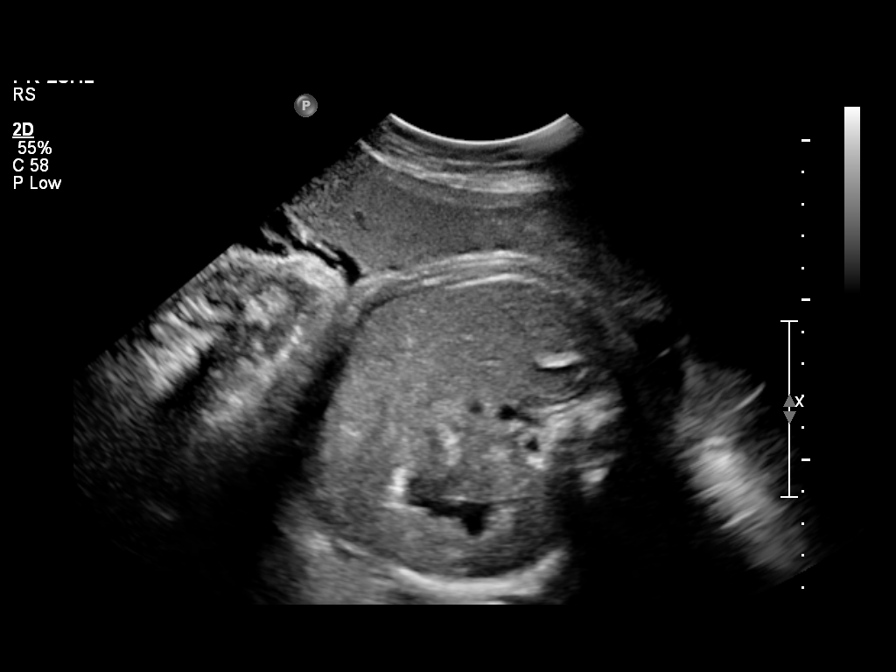

[9 of 9 positions shown; findings below may reference images not displayed]

BIOPHYSICAL PROFILE

Number of Fetuses: 1
Heart Rate: 132 bpm
Presentation: Cephalic
Movement: Yes
Placental Location:  Anterior
Previa:  No
Amniotic Fluid (Subjective):  Normal

Vertical pocket:  4.8 cm

BPP:
Movement:  2 / 2        Time:  20 minutes
Breathing: 2 / 2
Tone:   2 / 2
Amniotic Fluid:  2 / 2

Total Score:  8 / 8
IMPRESSION: Single live intrauterine pregnancy noted; biophysical profile score
of [DATE].

Recommend followup with non-emergent complete OB 14+ wk US
examination for fetal biometric evaluation and anatomic survey if
not already performed.

## 2014-04-01 ENCOUNTER — Encounter (HOSPITAL_COMMUNITY): Payer: Self-pay | Admitting: Obstetrics and Gynecology

## 2014-07-29 ENCOUNTER — Ambulatory Visit: Payer: 59 | Attending: Internal Medicine | Admitting: Internal Medicine

## 2014-07-29 ENCOUNTER — Encounter: Payer: Self-pay | Admitting: Internal Medicine

## 2014-07-29 VITALS — BP 122/85 | HR 74 | Temp 98.4°F | Resp 16 | Ht 61.0 in | Wt 129.0 lb

## 2014-07-29 DIAGNOSIS — Z862 Personal history of diseases of the blood and blood-forming organs and certain disorders involving the immune mechanism: Secondary | ICD-10-CM | POA: Diagnosis not present

## 2014-07-29 DIAGNOSIS — Z0000 Encounter for general adult medical examination without abnormal findings: Secondary | ICD-10-CM | POA: Diagnosis not present

## 2014-07-29 DIAGNOSIS — R200 Anesthesia of skin: Secondary | ICD-10-CM | POA: Insufficient documentation

## 2014-07-29 LAB — CBC WITH DIFFERENTIAL/PLATELET
BASOS ABS: 0 10*3/uL (ref 0.0–0.1)
BASOS PCT: 0 % (ref 0–1)
Eosinophils Absolute: 0.1 10*3/uL (ref 0.0–0.7)
Eosinophils Relative: 1 % (ref 0–5)
HCT: 38.8 % (ref 36.0–46.0)
Hemoglobin: 12.9 g/dL (ref 12.0–15.0)
Lymphocytes Relative: 37 % (ref 12–46)
Lymphs Abs: 1.9 10*3/uL (ref 0.7–4.0)
MCH: 30.5 pg (ref 26.0–34.0)
MCHC: 33.2 g/dL (ref 30.0–36.0)
MCV: 91.7 fL (ref 78.0–100.0)
MPV: 9.8 fL (ref 8.6–12.4)
Monocytes Absolute: 0.3 10*3/uL (ref 0.1–1.0)
Monocytes Relative: 5 % (ref 3–12)
NEUTROS ABS: 2.9 10*3/uL (ref 1.7–7.7)
NEUTROS PCT: 57 % (ref 43–77)
PLATELETS: 289 10*3/uL (ref 150–400)
RBC: 4.23 MIL/uL (ref 3.87–5.11)
RDW: 13.4 % (ref 11.5–15.5)
WBC: 5 10*3/uL (ref 4.0–10.5)

## 2014-07-29 LAB — COMPLETE METABOLIC PANEL WITH GFR
ALBUMIN: 4.4 g/dL (ref 3.5–5.2)
ALK PHOS: 60 U/L (ref 39–117)
ALT: 14 U/L (ref 0–35)
AST: 17 U/L (ref 0–37)
BUN: 11 mg/dL (ref 6–23)
CALCIUM: 9.7 mg/dL (ref 8.4–10.5)
CHLORIDE: 108 meq/L (ref 96–112)
CO2: 23 meq/L (ref 19–32)
Creat: 0.57 mg/dL (ref 0.50–1.10)
Glucose, Bld: 90 mg/dL (ref 70–99)
Potassium: 5 mEq/L (ref 3.5–5.3)
SODIUM: 140 meq/L (ref 135–145)
TOTAL PROTEIN: 7 g/dL (ref 6.0–8.3)
Total Bilirubin: 0.4 mg/dL (ref 0.2–1.2)

## 2014-07-29 LAB — LIPID PANEL
CHOL/HDL RATIO: 3.6 ratio
CHOLESTEROL: 153 mg/dL (ref 0–200)
HDL: 42 mg/dL — ABNORMAL LOW (ref >=46)
LDL CALC: 102 mg/dL — AB (ref 0–99)
TRIGLYCERIDES: 46 mg/dL (ref <150)
VLDL: 9 mg/dL (ref 0–40)

## 2014-07-29 LAB — HEMOGLOBIN A1C
HEMOGLOBIN A1C: 5.3 % (ref <5.7)
MEAN PLASMA GLUCOSE: 105 mg/dL (ref <117)

## 2014-07-29 LAB — TSH: TSH: 2.163 u[IU]/mL (ref 0.350–4.500)

## 2014-07-29 NOTE — Progress Notes (Signed)
Patient ID: Tracy Bradshaw, female   DOB: [birth year], 39 y.o.   MRN: [id number]  NOM:[id number]  JGG:[id number]  DOB - 08-28-1975  CC:  Chief Complaint  Patient presents with  . Establish Care       HPI: Tracy Bradshaw is a 39 y.o. female here today to establish medical care. She has a past medical history of anemia. She reports that she has been having problems with her arms and hands going numb.  She states that her hands mostly go numb at night. She denies any family history of diabetes. She reports that she is seeking pregnacy soon and wants to make sure all her blood work is normal.  LMP was 2/26. She also c/o of a achy pain in her knees after sitting for a long period of time.  She denies tobacco, drug, and alcohol use. No prior surgeries, only takes prenatal vitamin. She is married with one child. She does not exercise currently.  No Known Allergies Past Medical History  Diagnosis Date  . SAB (spontaneous abortion)   . Blood type O+   . Fibroid   . Abnormal Pap smear     HPV  . Cesarean delivery delivered (arrest of descent - 11/30) 04/29/2012  . Acute blood loss anemia 04/30/2012  . Anemia of mother in pregnancy, delivered 05/02/2012   Current Outpatient Prescriptions on File Prior to Visit  Medication Sig Dispense Refill  . Prenatal Vit-Fe Fumarate-FA (PRENATAL MULTIVITAMIN) TABS Take 1 tablet by mouth every evening.    . docusate sodium 100 MG CAPS Take 100 mg by mouth 2 (two) times daily. (Patient not taking: Reported on 07/29/2014) 30 capsule 1  . ibuprofen (ADVIL,MOTRIN) 600 MG tablet Take 1 tablet (600 mg total) by mouth every 6 (six) hours as needed for pain. (Patient not taking: Reported on 07/29/2014) 30 tablet 0  . iron polysaccharides (NIFEREX) 150 MG capsule Take 1 capsule (150 mg total) by mouth daily with breakfast. (Patient not taking: Reported on 07/29/2014) 20 capsule 5  . oxyCODONE-acetaminophen (PERCOCET/ROXICET) 5-325 MG per tablet Take 1-2 tablets by mouth every 4  (four) hours as needed (moderate - severe pain). (Patient not taking: Reported on 07/29/2014) 12 tablet 0  . simethicone (MYLICON) 80 MG chewable tablet Chew 1 tablet (80 mg total) by mouth every 6 (six) hours as needed for flatulence. (Patient not taking: Reported on 07/29/2014) 30 tablet    No current facility-administered medications on file prior to visit.   Family History  Problem Relation Age of Onset  . Hypertension Mother   . Heart disease Father   . Other Neg Hx    History   Social History  . Marital Status: Married    Spouse Name: N/A  . Number of Children: N/A  . Years of Education: N/A   Occupational History  . Not on file.   Social History Main Topics  . Smoking status: Never Smoker   . Smokeless tobacco: Never Used  . Alcohol Use: Yes     Comment: OCCASIONALLY  . Drug Use: No  . Sexual Activity: Yes    Birth Control/ Protection: None   Other Topics Concern  . Not on file   Social History Narrative    Review of Systems  Constitutional: Negative for malaise/fatigue.  Musculoskeletal: Positive for joint pain (knees).  Neurological: Positive for dizziness and tingling. Negative for weakness.  All other systems reviewed and are negative.     Objective:   Filed Vitals:   07/29/14 1116  BP:  122/85  Pulse: 74  Temp: 98.4 F (36.9 C)  Resp: 16    Physical Exam: Constitutional: Patient appears well-developed and well-nourished. No distress. HENT: Normocephalic, atraumatic, External right and left ear normal. Oropharynx is clear and moist.  Eyes: Conjunctivae and EOM are normal. PERRLA, no scleral icterus. Neck: Normal ROM. Neck supple. No JVD. No tracheal deviation. No thyromegaly. CVS: RRR, S1/S2 +, no murmurs, no gallops, no carotid bruit.  Pulmonary: Effort and breath sounds normal, no stridor, rhonchi, wheezes, rales.  Abdominal: Soft. BS +, no distension, tenderness, rebound or guarding.  Musculoskeletal: Normal range of motion. No edema and no  tenderness.  Lymphadenopathy: No lymphadenopathy noted, cervical, inguinal or axillary Neuro: Alert. Normal reflexes, muscle tone coordination. No cranial nerve deficit. Skin: Skin is warm and dry. No rash noted. Not diaphoretic. No erythema. No pallor. Psychiatric: Normal mood and affect. Behavior, judgment, thought content normal.  Lab Results  Component Value Date   WBC 18.1* 04/30/2012   HGB 8.9* 04/30/2012   HCT 27.6* 04/30/2012   MCV 95.2 04/30/2012   PLT 206 04/30/2012   No results found for: CREATININE, BUN, NA, K, CL, CO2  No results found for: HGBA1C Lipid Panel  No results found for: CHOL, TRIG, HDL, CHOLHDL, VLDL, LDLCALC     Assessment and plan:   Tracy Bradshaw was seen today for establish care.  Diagnoses and all orders for this visit:  Annual physical exam Orders: -     Lipid panel -     COMPLETE METABOLIC PANEL WITH GFR -     CBC with Differential -     TSH -     Hemoglobin A1c    Return if symptoms worsen or fail to improve.     Chari Manning, [facility] [phone] 07/29/2014, 11:46 AM

## 2014-07-29 NOTE — Patient Instructions (Signed)

## 2014-07-29 NOTE — Progress Notes (Signed)
Pt is here to establish care. Pt is requesting labs she said that she is fasting. Pt reports that sometimes her arms and hands go numb. Pt states that when she sits down for long periods her knees become painful and she has to take her time standing up.

## 2014-11-28 LAB — OB RESULTS CONSOLE ABO/RH: RH Type: POSITIVE

## 2014-11-28 LAB — OB RESULTS CONSOLE HIV ANTIBODY (ROUTINE TESTING): HIV: NONREACTIVE

## 2014-11-28 LAB — OB RESULTS CONSOLE ANTIBODY SCREEN: ANTIBODY SCREEN: NEGATIVE

## 2014-11-28 LAB — OB RESULTS CONSOLE RPR: RPR: NONREACTIVE

## 2014-11-28 LAB — OB RESULTS CONSOLE RUBELLA ANTIBODY, IGM: RUBELLA: IMMUNE

## 2014-11-28 LAB — OB RESULTS CONSOLE HEPATITIS B SURFACE ANTIGEN: Hepatitis B Surface Ag: NEGATIVE

## 2014-12-03 LAB — OB RESULTS CONSOLE GC/CHLAMYDIA
Chlamydia: NEGATIVE
GC PROBE AMP, GENITAL: NEGATIVE

## 2015-06-03 ENCOUNTER — Other Ambulatory Visit: Payer: Self-pay | Admitting: Obstetrics

## 2015-06-24 ENCOUNTER — Encounter (HOSPITAL_COMMUNITY): Payer: Self-pay

## 2015-06-25 ENCOUNTER — Encounter (HOSPITAL_COMMUNITY)
Admission: RE | Admit: 2015-06-25 | Discharge: 2015-06-25 | Disposition: A | Discharge status: Home / Self Care | Payer: 59 | Source: Ambulatory Visit | Attending: Obstetrics | Admitting: Obstetrics

## 2015-06-25 ENCOUNTER — Encounter (HOSPITAL_COMMUNITY): Payer: Self-pay

## 2015-06-25 HISTORY — DX: Other specified postprocedural states: Z98.890

## 2015-06-25 HISTORY — DX: Nausea with vomiting, unspecified: R11.2

## 2015-06-25 LAB — CBC
HEMATOCRIT: 35.6 % — AB (ref 36.0–46.0)
HEMOGLOBIN: 11.5 g/dL — AB (ref 12.0–15.0)
MCH: 30.3 pg (ref 26.0–34.0)
MCHC: 32.3 g/dL (ref 30.0–36.0)
MCV: 93.9 fL (ref 78.0–100.0)
Platelets: 229 10*3/uL (ref 150–400)
RBC: 3.79 MIL/uL — ABNORMAL LOW (ref 3.87–5.11)
RDW: 14 % (ref 11.5–15.5)
WBC: 8.9 10*3/uL (ref 4.0–10.5)

## 2015-06-25 LAB — TYPE AND SCREEN
ABO/RH(D): O POS
ANTIBODY SCREEN: NEGATIVE

## 2015-06-25 NOTE — Patient Instructions (Signed)
Your procedure is scheduled on:  Friday, Jan. 27, 2017  Enter through the Main Entrance of [facility] at:  9:30 A.M.  Pick up the phone at the desk and dial [phone].  Call this number if you have problems the morning of surgery: [phone].  Remember: Do NOT eat food or drink after:  After Midnight Thursday  Take these medicines the morning of surgery with a SIP OF WATER:  None  Do NOT wear jewelry (body piercing), metal hair clips/bobby pins, or nail polish. Do NOT wear lotions, powders, or perfumes.  You may wear deoderant. Do NOT shave for 48 hours prior to surgery. Do NOT bring valuables to the hospital.  Leave suitcase in car.  After surgery it may be brought to your room.  For patients admitted to the hospital, checkout time is 11:00 AM the day of discharge.

## 2015-06-26 ENCOUNTER — Other Ambulatory Visit (HOSPITAL_COMMUNITY): Payer: 59

## 2015-06-26 LAB — RPR: RPR: NONREACTIVE

## 2015-06-27 ENCOUNTER — Encounter (HOSPITAL_COMMUNITY): Payer: Self-pay

## 2015-06-27 ENCOUNTER — Encounter (HOSPITAL_COMMUNITY)
Admission: RE | Disposition: A | Discharge status: Home / Self Care | Payer: Self-pay | Source: Ambulatory Visit | Attending: Obstetrics

## 2015-06-27 ENCOUNTER — Inpatient Hospital Stay (HOSPITAL_COMMUNITY)
Admission: RE | Admit: 2015-06-27 | Discharge: 2015-06-29 | DRG: 765 | Disposition: A | Discharge status: Home / Self Care | Payer: 59 | Source: Ambulatory Visit | Attending: Obstetrics | Admitting: Obstetrics

## 2015-06-27 ENCOUNTER — Inpatient Hospital Stay (HOSPITAL_COMMUNITY): Payer: 59 | Admitting: Anesthesiology

## 2015-06-27 DIAGNOSIS — O34211 Maternal care for low transverse scar from previous cesarean delivery: Principal | ICD-10-CM | POA: Diagnosis present

## 2015-06-27 DIAGNOSIS — O9081 Anemia of the puerperium: Secondary | ICD-10-CM | POA: Diagnosis not present

## 2015-06-27 DIAGNOSIS — D62 Acute posthemorrhagic anemia: Secondary | ICD-10-CM | POA: Diagnosis not present

## 2015-06-27 DIAGNOSIS — Z3A39 39 weeks gestation of pregnancy: Secondary | ICD-10-CM

## 2015-06-27 DIAGNOSIS — Z8249 Family history of ischemic heart disease and other diseases of the circulatory system: Secondary | ICD-10-CM

## 2015-06-27 DIAGNOSIS — Z98891 History of uterine scar from previous surgery: Secondary | ICD-10-CM

## 2015-06-27 SURGERY — Surgical Case
Anesthesia: Spinal

## 2015-06-27 MED ORDER — NALBUPHINE HCL 10 MG/ML IJ SOLN: 5.0000 mg | INTRAMUSCULAR | Status: DC | PRN

## 2015-06-27 MED ORDER — NALBUPHINE HCL 10 MG/ML IJ SOLN: 5.0000 mg | Freq: Once | INTRAMUSCULAR | Status: DC | PRN

## 2015-06-27 MED ORDER — SCOPOLAMINE 1 MG/3DAYS TD PT72
MEDICATED_PATCH | TRANSDERMAL | Status: AC
  Administered 2015-06-27: 1.5 mg via TRANSDERMAL
  Filled 2015-06-27: qty 1

## 2015-06-27 MED ORDER — FENTANYL CITRATE (PF) 100 MCG/2ML IJ SOLN
INTRAMUSCULAR | Status: DC | PRN
  Administered 2015-06-27: 10 ug via INTRATHECAL

## 2015-06-27 MED ORDER — PHENYLEPHRINE 8 MG IN D5W 100 ML (0.08MG/ML) PREMIX OPTIME
INJECTION | INTRAVENOUS | Status: AC
  Filled 2015-06-27: qty 100

## 2015-06-27 MED ORDER — SODIUM CHLORIDE 0.9 % IR SOLN
Status: DC | PRN
  Administered 2015-06-27: 1000 mL

## 2015-06-27 MED ORDER — MENTHOL 3 MG MT LOZG
1.0000 | LOZENGE | OROMUCOSAL | Status: DC | PRN
Start: 2015-06-27 — End: 2015-06-29

## 2015-06-27 MED ORDER — DIBUCAINE 1 % RE OINT: 1.0000 "application " | TOPICAL_OINTMENT | RECTAL | Status: DC | PRN

## 2015-06-27 MED ORDER — KETOROLAC TROMETHAMINE 30 MG/ML IJ SOLN
INTRAMUSCULAR | Status: AC
  Filled 2015-06-27: qty 1

## 2015-06-27 MED ORDER — NALOXONE HCL 0.4 MG/ML IJ SOLN: 0.4000 mg | INTRAMUSCULAR | Status: DC | PRN

## 2015-06-27 MED ORDER — MORPHINE SULFATE (PF) 0.5 MG/ML IJ SOLN
INTRAMUSCULAR | Status: AC
  Filled 2015-06-27: qty 10

## 2015-06-27 MED ORDER — SODIUM CHLORIDE 0.9% FLUSH
INTRAVENOUS | Status: AC
  Filled 2015-06-27: qty 12

## 2015-06-27 MED ORDER — OXYTOCIN 10 UNIT/ML IJ SOLN: 2.5000 [IU]/h | INTRAVENOUS | Status: DC

## 2015-06-27 MED ORDER — ACETAMINOPHEN 500 MG PO TABS
1000.0000 mg | ORAL_TABLET | Freq: Four times a day (QID) | ORAL | Status: DC
  Administered 2015-06-27 (×2): 1000 mg via ORAL
  Filled 2015-06-27 (×2): qty 2

## 2015-06-27 MED ORDER — SENNOSIDES-DOCUSATE SODIUM 8.6-50 MG PO TABS
2.0000 | ORAL_TABLET | ORAL | Status: DC
  Administered 2015-06-27 – 2015-06-29 (×2): 2 via ORAL
  Filled 2015-06-27 (×2): qty 2

## 2015-06-27 MED ORDER — HYDROMORPHONE HCL 1 MG/ML IJ SOLN: 0.2500 mg | INTRAMUSCULAR | Status: DC | PRN

## 2015-06-27 MED ORDER — TETANUS-DIPHTH-ACELL PERTUSSIS 5-2.5-18.5 LF-MCG/0.5 IM SUSP: 0.5000 mL | Freq: Once | INTRAMUSCULAR | Status: DC

## 2015-06-27 MED ORDER — MEPERIDINE HCL 25 MG/ML IJ SOLN: 6.2500 mg | INTRAMUSCULAR | Status: DC | PRN

## 2015-06-27 MED ORDER — IBUPROFEN 600 MG PO TABS
600.0000 mg | ORAL_TABLET | Freq: Four times a day (QID) | ORAL | Status: DC
Start: 2015-06-27 — End: 2015-06-29
  Administered 2015-06-27 – 2015-06-29 (×7): 600 mg via ORAL
  Filled 2015-06-27 (×8): qty 1

## 2015-06-27 MED ORDER — FENTANYL CITRATE (PF) 100 MCG/2ML IJ SOLN
INTRAMUSCULAR | Status: AC
  Filled 2015-06-27: qty 2

## 2015-06-27 MED ORDER — SIMETHICONE 80 MG PO CHEW
80.0000 mg | CHEWABLE_TABLET | Freq: Three times a day (TID) | ORAL | Status: DC
  Administered 2015-06-27 – 2015-06-29 (×4): 80 mg via ORAL
  Filled 2015-06-27 (×5): qty 1

## 2015-06-27 MED ORDER — SODIUM CHLORIDE 0.9% FLUSH: 3.0000 mL | INTRAVENOUS | Status: DC | PRN

## 2015-06-27 MED ORDER — SCOPOLAMINE 1 MG/3DAYS TD PT72
1.0000 | MEDICATED_PATCH | Freq: Once | TRANSDERMAL | Status: DC
  Administered 2015-06-27: 1.5 mg via TRANSDERMAL

## 2015-06-27 MED ORDER — ZOLPIDEM TARTRATE 5 MG PO TABS: 5.0000 mg | ORAL_TABLET | Freq: Every evening | ORAL | Status: DC | PRN

## 2015-06-27 MED ORDER — LACTATED RINGERS IV SOLN
INTRAVENOUS | Status: DC
  Administered 2015-06-27: 19:00:00 via INTRAVENOUS

## 2015-06-27 MED ORDER — PHENYLEPHRINE 8 MG IN D5W 100 ML (0.08MG/ML) PREMIX OPTIME
INJECTION | INTRAVENOUS | Status: DC | PRN
  Administered 2015-06-27: 60 ug/min via INTRAVENOUS

## 2015-06-27 MED ORDER — KETOROLAC TROMETHAMINE 30 MG/ML IJ SOLN: 30.0000 mg | Freq: Once | INTRAMUSCULAR | Status: DC

## 2015-06-27 MED ORDER — ONDANSETRON HCL 4 MG/2ML IJ SOLN
INTRAMUSCULAR | Status: AC
  Filled 2015-06-27: qty 2

## 2015-06-27 MED ORDER — MORPHINE SULFATE (PF) 0.5 MG/ML IJ SOLN
INTRAMUSCULAR | Status: DC | PRN
  Administered 2015-06-27: .2 mg via INTRATHECAL

## 2015-06-27 MED ORDER — PRENATAL MULTIVITAMIN CH
1.0000 | ORAL_TABLET | Freq: Every day | ORAL | Status: DC
  Administered 2015-06-28 – 2015-06-29 (×2): 1 via ORAL
  Filled 2015-06-27 (×2): qty 1

## 2015-06-27 MED ORDER — DIPHENHYDRAMINE HCL 50 MG/ML IJ SOLN: 12.5000 mg | INTRAMUSCULAR | Status: DC | PRN

## 2015-06-27 MED ORDER — ONDANSETRON HCL 4 MG/2ML IJ SOLN
INTRAMUSCULAR | Status: AC
Start: 2015-06-27 — End: 2015-06-27
  Filled 2015-06-27: qty 2

## 2015-06-27 MED ORDER — CEFAZOLIN SODIUM-DEXTROSE 2-3 GM-% IV SOLR
2.0000 g | INTRAVENOUS | Status: AC
  Administered 2015-06-27: 2 g via INTRAVENOUS

## 2015-06-27 MED ORDER — SCOPOLAMINE 1 MG/3DAYS TD PT72: 1.0000 | MEDICATED_PATCH | Freq: Once | TRANSDERMAL | Status: DC

## 2015-06-27 MED ORDER — LANOLIN HYDROUS EX OINT: 1.0000 "application " | TOPICAL_OINTMENT | CUTANEOUS | Status: DC | PRN

## 2015-06-27 MED ORDER — SIMETHICONE 80 MG PO CHEW: 80.0000 mg | CHEWABLE_TABLET | ORAL | Status: DC | PRN

## 2015-06-27 MED ORDER — SIMETHICONE 80 MG PO CHEW
80.0000 mg | CHEWABLE_TABLET | ORAL | Status: DC
  Administered 2015-06-27 – 2015-06-29 (×2): 80 mg via ORAL
  Filled 2015-06-27: qty 1

## 2015-06-27 MED ORDER — LACTATED RINGERS IV SOLN
Freq: Once | INTRAVENOUS | Status: AC
  Administered 2015-06-27: 1000 mL/h via INTRAVENOUS

## 2015-06-27 MED ORDER — LACTATED RINGERS IV SOLN
INTRAVENOUS | Status: DC | PRN
  Administered 2015-06-27 (×3): via INTRAVENOUS

## 2015-06-27 MED ORDER — MORPHINE SULFATE (PF) 10 MG/ML IV SOLN
INTRAVENOUS | Status: DC | PRN
  Administered 2015-06-27: 2.3 mg via INTRAVENOUS

## 2015-06-27 MED ORDER — DIPHENHYDRAMINE HCL 25 MG PO CAPS: 25.0000 mg | ORAL_CAPSULE | Freq: Four times a day (QID) | ORAL | Status: DC | PRN

## 2015-06-27 MED ORDER — BUPIVACAINE IN DEXTROSE 0.75-8.25 % IT SOLN
INTRATHECAL | Status: DC | PRN
Start: 2015-06-27 — End: 2015-06-27
  Administered 2015-06-27: 10.5 mg via INTRATHECAL

## 2015-06-27 MED ORDER — KETOROLAC TROMETHAMINE 30 MG/ML IJ SOLN
30.0000 mg | Freq: Four times a day (QID) | INTRAMUSCULAR | Status: DC | PRN
  Administered 2015-06-27: 30 mg via INTRAMUSCULAR

## 2015-06-27 MED ORDER — IBUPROFEN 600 MG PO TABS
600.0000 mg | ORAL_TABLET | Freq: Four times a day (QID) | ORAL | Status: DC | PRN
  Administered 2015-06-27: 600 mg via ORAL

## 2015-06-27 MED ORDER — DIPHENHYDRAMINE HCL 25 MG PO CAPS
25.0000 mg | ORAL_CAPSULE | ORAL | Status: DC | PRN
  Filled 2015-06-27: qty 1

## 2015-06-27 MED ORDER — NALOXONE HCL 2 MG/2ML IJ SOSY
1.0000 ug/kg/h | PREFILLED_SYRINGE | INTRAMUSCULAR | Status: DC | PRN
  Filled 2015-06-27: qty 2

## 2015-06-27 MED ORDER — OXYTOCIN 10 UNIT/ML IJ SOLN
INTRAMUSCULAR | Status: AC
  Filled 2015-06-27: qty 4

## 2015-06-27 MED ORDER — ONDANSETRON HCL 4 MG/2ML IJ SOLN
INTRAMUSCULAR | Status: DC | PRN
  Administered 2015-06-27: 4 mg via INTRAVENOUS

## 2015-06-27 MED ORDER — ONDANSETRON HCL 4 MG/2ML IJ SOLN: 4.0000 mg | Freq: Three times a day (TID) | INTRAMUSCULAR | Status: DC | PRN

## 2015-06-27 MED ORDER — KETOROLAC TROMETHAMINE 30 MG/ML IJ SOLN: 30.0000 mg | Freq: Four times a day (QID) | INTRAMUSCULAR | Status: DC | PRN

## 2015-06-27 MED ORDER — OXYTOCIN 10 UNIT/ML IJ SOLN
40.0000 [IU] | INTRAMUSCULAR | Status: DC | PRN
  Administered 2015-06-27: 40 [IU] via INTRAVENOUS

## 2015-06-27 MED ORDER — FENTANYL CITRATE (PF) 250 MCG/5ML IJ SOLN
INTRAMUSCULAR | Status: DC | PRN
  Administered 2015-06-27: 50 ug via INTRAVENOUS
  Administered 2015-06-27: 40 ug via INTRAVENOUS

## 2015-06-27 MED ORDER — ACETAMINOPHEN 325 MG PO TABS
650.0000 mg | ORAL_TABLET | ORAL | Status: DC | PRN
  Administered 2015-06-28 – 2015-06-29 (×4): 650 mg via ORAL
  Filled 2015-06-27 (×4): qty 2

## 2015-06-27 MED ORDER — PROMETHAZINE HCL 25 MG/ML IJ SOLN: 6.2500 mg | INTRAMUSCULAR | Status: DC | PRN

## 2015-06-27 MED ORDER — WITCH HAZEL-GLYCERIN EX PADS: 1.0000 "application " | MEDICATED_PAD | CUTANEOUS | Status: DC | PRN

## 2015-06-27 MED ORDER — CEFAZOLIN SODIUM-DEXTROSE 2-3 GM-% IV SOLR
INTRAVENOUS | Status: AC
Start: 2015-06-27 — End: 2015-06-27
  Filled 2015-06-27: qty 50

## 2015-06-27 MED ORDER — FENTANYL CITRATE (PF) 100 MCG/2ML IJ SOLN
INTRAMUSCULAR | Status: AC
Start: 2015-06-27 — End: 2015-06-27
  Filled 2015-06-27: qty 2

## 2015-06-27 SURGICAL SUPPLY — 40 items
BENZOIN TINCTURE PRP APPL 2/3 (GAUZE/BANDAGES/DRESSINGS) ×3 IMPLANT
CLAMP CORD UMBIL (MISCELLANEOUS) IMPLANT
CLOSURE STERI-STRIP 1/2X4 (GAUZE/BANDAGES/DRESSINGS) ×1
CLOSURE WOUND 1/2 X4 (GAUZE/BANDAGES/DRESSINGS)
CLOTH BEACON ORANGE TIMEOUT ST (SAFETY) ×3 IMPLANT
CLSR STERI-STRIP ANTIMIC 1/2X4 (GAUZE/BANDAGES/DRESSINGS) ×2 IMPLANT
CONTAINER PREFILL 10% NBF 15ML (MISCELLANEOUS) IMPLANT
DRAPE SHEET LG 3/4 BI-LAMINATE (DRAPES) IMPLANT
DRSG OPSITE POSTOP 4X10 (GAUZE/BANDAGES/DRESSINGS) ×3 IMPLANT
DURAPREP 26ML APPLICATOR (WOUND CARE) ×3 IMPLANT
ELECT REM PT RETURN 9FT ADLT (ELECTROSURGICAL) ×3
ELECTRODE REM PT RTRN 9FT ADLT (ELECTROSURGICAL) ×1 IMPLANT
EXTRACTOR VACUUM M CUP 4 TUBE (SUCTIONS) IMPLANT
EXTRACTOR VACUUM M CUP 4' TUBE (SUCTIONS)
GLOVE BIO SURGEON STRL SZ 6.5 (GLOVE) ×2 IMPLANT
GLOVE BIO SURGEONS STRL SZ 6.5 (GLOVE) ×1
GLOVE BIOGEL PI IND STRL 7.0 (GLOVE) ×2 IMPLANT
GLOVE BIOGEL PI INDICATOR 7.0 (GLOVE) ×4
GOWN STRL REUS W/TWL LRG LVL3 (GOWN DISPOSABLE) ×6 IMPLANT
KIT ABG SYR 3ML LUER SLIP (SYRINGE) IMPLANT
NEEDLE HYPO 22GX1.5 SAFETY (NEEDLE) IMPLANT
NEEDLE HYPO 25X5/8 SAFETYGLIDE (NEEDLE) IMPLANT
NS IRRIG 1000ML POUR BTL (IV SOLUTION) ×3 IMPLANT
PACK C SECTION WH (CUSTOM PROCEDURE TRAY) ×3 IMPLANT
PAD OB MATERNITY 4.3X12.25 (PERSONAL CARE ITEMS) ×3 IMPLANT
PENCIL SMOKE EVAC W/HOLSTER (ELECTROSURGICAL) ×3 IMPLANT
STRIP CLOSURE SKIN 1/2X4 (GAUZE/BANDAGES/DRESSINGS) IMPLANT
SUT MON AB 4-0 PS1 27 (SUTURE) ×3 IMPLANT
SUT PLAIN 0 NONE (SUTURE) IMPLANT
SUT PLAIN 2 0 (SUTURE) ×2
SUT PLAIN 2 0 XLH (SUTURE) IMPLANT
SUT PLAIN ABS 2-0 CT1 27XMFL (SUTURE) ×1 IMPLANT
SUT VIC AB 0 CT1 36 (SUTURE) ×6 IMPLANT
SUT VIC AB 0 CTX 36 (SUTURE) ×6
SUT VIC AB 0 CTX36XBRD ANBCTRL (SUTURE) ×3 IMPLANT
SUT VIC AB 2-0 CT1 27 (SUTURE) ×2
SUT VIC AB 2-0 CT1 TAPERPNT 27 (SUTURE) ×1 IMPLANT
SYR CONTROL 10ML LL (SYRINGE) IMPLANT
TOWEL OR 17X24 6PK STRL BLUE (TOWEL DISPOSABLE) ×3 IMPLANT
TRAY FOLEY CATH SILVER 14FR (SET/KITS/TRAYS/PACK) IMPLANT

## 2015-06-27 NOTE — Consult Note (Signed)
Neonatology Note:   Attendance at C-section:    I was asked by Dr. Valentino Saxon to attend this repeat C/S at term. The mother is a G3P1A1 O pos, GBS neg with AMA and fibroids. ROM at delivery, fluid clear. Infant vigorous with good spontaneous cry and tone. Needed only minimal bulb suctioning. Ap 9/9. Lungs clear to ausc in DR. To CN to care of Pediatrician.   Real Cons, MD

## 2015-06-27 NOTE — H&P (Signed)
Tracy Bradshaw is a 40 y.o. G3P1011 at [redacted]w[redacted]d presenting for RCS. Pt notes rare contractions. Good fetal movement, No vaginal bleeding, not leaking fluid   [facility] at [place] since 7 wks - Dated by LMP c/w 7 wk u./s AMA, declined genetic screening. 3rd trimester testing reactive - AGA   Prenatal Transfer Tool  Maternal Diabetes: No Genetic Screening: Declined Maternal Ultrasounds/Referrals: Normal Fetal Ultrasounds or other Referrals:  None Maternal Substance Abuse:  No Significant Maternal Medications:  None Significant Maternal Lab Results: None     OB History    Gravida Para Term Preterm AB TAB SAB Ectopic Multiple Living   3 1 1  1  1   1      Past Medical History  Diagnosis Date  . SAB (spontaneous abortion)   . Blood type O+   . Fibroid   . Abnormal Pap smear     HPV  . Cesarean delivery delivered (arrest of descent - 11/30) 04/29/2012  . Acute blood loss anemia 04/30/2012  . Anemia of mother in pregnancy, delivered 05/02/2012  . PONV (postoperative nausea and vomiting)    Past Surgical History  Procedure Laterality Date  . No past surgeries    . Cesarean section  04/29/2012    Procedure: CESAREAN SECTION;  Surgeon: Elveria Royals, MD;  Location: [facility];  Service: Obstetrics;  Laterality: N/A;   Family History: family history includes Heart disease in her father; Hypertension in her mother. There is no history of Other. Social History:  reports that she has never smoked. She has never used smokeless tobacco. She reports that she drinks alcohol. She reports that she does not use illicit drugs.  Review of Systems - Negative except discomfort of preg     Blood pressure 129/84, pulse 86, temperature 98.3 F (36.8 C), temperature source Oral, resp. rate 20, last menstrual period 09/23/2014, SpO2 100 %, unknown if currently breastfeeding.  Physical Exam:  Gen: well appearing, no distress  Back: no CVAT Abd: gravid, NT, no RUQ pain LE: trace edema, equal  bilaterally, non-tender   Prenatal labs: ABO, Rh: --/--/O POS (01/25 1030) Antibody: NEG (01/25 1030) Rubella: !Error!immune RPR: Non Reactive (01/25 1030)  HBsAg: Negative (06/30 0000)  HIV: Non-reactive (06/30 0000)  GBS:   neg 1 hr Glucola 142, nl 3 hr  Genetic screening declined, nl AFP Anatomy US normal   Assessment/Plan: 40 y.o. G3P1011 at [redacted]w[redacted]d RCS. R/B d/w pt   Tracy Bradshaw A. 06/27/2015, 10:51 AM

## 2015-06-27 NOTE — Progress Notes (Signed)
Vital signs and fundal check not obtained, however, pt stable per monitor at nurses station. O2 sat WNL

## 2015-06-27 NOTE — Lactation Note (Signed)
This note was copied from the chart of [facility]. Lactation Consultation Note Initial visit at 6 hours of age.  Mom reports good feedings and last feeding was about 20 minutes.  Mom is holding baby STS now and baby is asleep.  [facility] LC resources given and discussed.  Encouraged to feed with early cues on demand.  Early newborn behavior discussed.  Hand expression reported by mom with colostrum visible. Mom encouraged to call for LATCH score this evening.   Mom to call for assist as needed.    Patient Name: Tracy Bradshaw [id number] Date: 06/27/2015 Reason for consult: Initial assessment   Maternal Data Has patient been taught Hand Expression?: Yes Does the patient have breastfeeding experience prior to this delivery?: Yes  Feeding Feeding Type: Breast Fed Length of feed: 10 min  LATCH Score/Interventions                      Lactation Tools Discussed/Used WIC Program: No   Consult Status Consult Status: Follow-up Date: 06/28/15 Follow-up type: In-patient    Tracy Bradshaw 06/27/2015, 5:47 PM

## 2015-06-27 NOTE — Anesthesia Postprocedure Evaluation (Signed)
Anesthesia Post Note  Patient: Tracy Bradshaw  Procedure(s) Performed: Procedure(s) (LRB): Repeat CESAREAN SECTION (N/A)  Patient location during evaluation: PACU Anesthesia Type: Spinal Level of consciousness: awake Pain management: pain level controlled Vital Signs Assessment: post-procedure vital signs reviewed and stable Respiratory status: spontaneous breathing Cardiovascular status: stable Postop Assessment: no headache, no backache, spinal receding, patient able to bend at knees and no signs of nausea or vomiting Anesthetic complications: no    Last Vitals:  Filed Vitals:   06/27/15 1221 06/27/15 1230  BP:  125/83  Pulse: 89 90  Temp:    Resp: 20 20    Last Pain: There were no vitals filed for this visit.               [place]

## 2015-06-27 NOTE — Anesthesia Procedure Notes (Signed)
Spinal Patient location during procedure: OR Start time: 06/27/2015 11:03 AM End time: 06/27/2015 11:05 AM Staffing Anesthesiologist: Lyn Hollingshead Performed by: anesthesiologist  Preanesthetic Checklist Completed: patient identified, surgical consent, pre-op evaluation, timeout performed, IV checked, risks and benefits discussed and monitors and equipment checked Spinal Block Patient position: sitting Prep: DuraPrep Patient monitoring: heart rate, cardiac monitor, continuous pulse ox and blood pressure Approach: midline Location: L3-4 Injection technique: single-shot Needle Needle type: Sprotte  Needle gauge: 24 G Needle length: 9 cm Needle insertion depth: 5 cm Assessment Sensory level: T10

## 2015-06-27 NOTE — Transfer of Care (Signed)
Immediate Anesthesia Transfer of Care Note  Patient: Tracy Bradshaw  Procedure(s) Performed: Procedure(s) with comments: Repeat CESAREAN SECTION (N/A) - EDD: 06/30/15  Patient Location: PACU  Anesthesia Type:Spinal  Level of Consciousness: awake and alert   Airway & Oxygen Therapy: Patient Spontanous Breathing  Post-op Assessment: Report given to RN and Post -op Vital signs reviewed and stable  Post vital signs: Reviewed and stable  Last Vitals:  Filed Vitals:   06/27/15 0945  BP: 129/84  Pulse: 86  Temp: 36.8 C  Resp: 20    Complications: No apparent anesthesia complications

## 2015-06-27 NOTE — Brief Op Note (Signed)
06/27/2015  12:06 PM  PATIENT:  Tracy Bradshaw  40 y.o. female  PRE-OPERATIVE DIAGNOSIS:  Previous Cesarean Section  POST-OPERATIVE DIAGNOSIS:  Previous Cesarean Section  PROCEDURE:  Procedure(s) with comments: Repeat CESAREAN SECTION (N/A) - EDD: 06/30/15  2 layer closure  SURGEON:  Surgeon(s) and Role:    * Aloha Gell, MD - Primary  PHYSICIAN ASSISTANT:   ASSISTANTSRenato Battles, CNM   ANESTHESIA:   spinal  EBL:  Total I/O In: 2000 [I.V.:2000] Out: 800 [Blood:800]  BLOOD ADMINISTERED:none  DRAINS: Urinary Catheter (Foley)   LOCAL MEDICATIONS USED:  NONE  SPECIMEN:  Source of Specimen:  placenta  DISPOSITION OF SPECIMEN:  L&D  COUNTS:  YES  TOURNIQUET:  * No tourniquets in log *  DICTATION: .Note written in EPIC  PLAN OF CARE: Admit to inpatient   PATIENT DISPOSITION:  PACU - hemodynamically stable.   Delay start of Pharmacological VTE agent (>24hrs) due to surgical blood loss or risk of bleeding: yes

## 2015-06-27 NOTE — Op Note (Signed)
06/27/2015  12:06 PM  PATIENT:  Tracy Bradshaw  40 y.o. female  PRE-OPERATIVE DIAGNOSIS:  Previous Cesarean Section  POST-OPERATIVE DIAGNOSIS:  Previous Cesarean Section  PROCEDURE:  Procedure(s) with comments: Repeat CESAREAN SECTION (N/A) - EDD: 06/30/15  2 layer closure  SURGEON:  Surgeon(s) and Role:    * Aloha Gell, MD - Primary  PHYSICIAN ASSISTANT:   ASSISTANTSRenato Battles, CNM   ANESTHESIA:   spinal  EBL:  Total I/O In: 2000 [I.V.:2000] Out: 800 [Blood:800]  BLOOD ADMINISTERED:none  DRAINS: Urinary Catheter (Foley)   LOCAL MEDICATIONS USED:  NONE  SPECIMEN:  Source of Specimen:  placenta  DISPOSITION OF SPECIMEN:  L&D  COUNTS:  YES  TOURNIQUET:  * No tourniquets in log *  DICTATION: .Note written in EPIC  PLAN OF CARE: Admit to inpatient   PATIENT DISPOSITION:  PACU - hemodynamically stable.   Delay start of Pharmacological VTE agent (>24hrs) due to surgical blood loss or risk of bleeding: yes   Findings:  @BABYSEXEBC @ infant,  APGAR (1 MIN): 9   APGAR (5 MINS): 9   APGAR (10 MINS):   Normal uterus, tubes and ovaries, normal placenta. 3VC, clear amniotic fluid  EBL: per anasthesia Antibiotics:   2g Ancef Complications: none  Indications: This is a 40 y.o. year-old, G2P1  At [redacted]w[redacted]d admitted for RCS. Risks benefits and alternatives of the procedure were discussed with the patient who agreed to proceed  Procedure:  After informed consent was obtained the patient was taken to the operating room where spinal anesthesia was initiated.  She was prepped and draped in the normal sterile fashion in dorsal supine position with a leftward tilt.  A foley catheter was in place.  A Pfannenstiel skin incision was made 2 cm above the pubic symphysis in the midline with the scalpel.  Dissection was carried down with the Bovie cautery until the fascia was reached. The fascia was incised in the midline. The incision was extended laterally with the Mayo scissors. The inferior  aspect of the fascial incision was grasped with the Coker clamps, elevated up and the underlying rectus muscles were dissected off sharply. The superior aspect of the fascial incision was grasped with the Coker clamps elevated up and the underlying rectus muscles were dissected off sharply.  The peritoneum was entered sharply. The peritoneal incision was extended superiorly and inferiorly with good visualization of the bladder. The bladder blade was inserted and palpation was done to assess the fetal position and the location of the uterine vessels. The lower segment of the uterus was incised sharply with the scalpel and extended  bluntly in the cephalo-caudal fashion. The infant was grasped, brought to the incision,  rotated and the infant was delivered with fundal pressure. The nose and mouth were bulb suctioned. The cord was clamped and cut. The infant was handed off to the waiting pediatrician. The placenta was expressed. The uterus was left in situ. The uterus was cleared of all clots and debris. The uterine incision was repaired with 0 Vicryl in a running locked fashion.  A second layer of the same suture was used in an imbricating fashion to obtain excellent hemostasis. The gutters were cleared of all clots and debris. The uterine incision was reinspected and found to be hemostatic. The peritoneum was grasped and closed with 2-0 Vicryl in a running fashion. The cut muscle edges and the underside of the fascia were inspected and found to be hemostatic. The fascia was closed with 0 Vicryl in two halves .  The subcutaneous tissue was irrigated. Scarpa's layer was closed with a 2-0 plain gut suture. The skin was closed with a 4-0 Monocryl in a single layer. The patient tolerated the procedure well. Sponge lap and needle counts were correct x3 and patient was taken to the recovery room in a stable condition.  [name] A. 06/27/2015 12:08 PM

## 2015-06-27 NOTE — Anesthesia Preprocedure Evaluation (Signed)
Anesthesia Evaluation  Patient identified by MRN, date of birth, ID band Patient awake    Reviewed: Allergy & Precautions, H&P , NPO status , Patient's Chart, lab work & pertinent test results  Airway Mallampati: I  TM Distance: >3 FB Neck ROM: full    Dental no notable dental hx.    Pulmonary neg pulmonary ROS,    Pulmonary exam normal        Cardiovascular negative cardio ROS Normal cardiovascular exam     Neuro/Psych negative neurological ROS  negative psych ROS   GI/Hepatic negative GI ROS, Neg liver ROS,   Endo/Other  negative endocrine ROS  Renal/GU negative Renal ROS     Musculoskeletal   Abdominal Normal abdominal exam  (+)   Peds  Hematology   Anesthesia Other Findings   Reproductive/Obstetrics (+) Pregnancy                             Anesthesia Physical Anesthesia Plan  ASA: II  Anesthesia Plan: Spinal   Post-op Pain Management:    Induction:   Airway Management Planned:   Additional Equipment:   Intra-op Plan:   Post-operative Plan:   Informed Consent: I have reviewed the patients History and Physical, chart, labs and discussed the procedure including the risks, benefits and alternatives for the proposed anesthesia with the patient or authorized representative who has indicated his/her understanding and acceptance.     Plan Discussed with: Surgeon and CRNA  Anesthesia Plan Comments:         Anesthesia Quick Evaluation

## 2015-06-28 LAB — CBC
HCT: 29.4 % — ABNORMAL LOW (ref 36.0–46.0)
Hemoglobin: 9.5 g/dL — ABNORMAL LOW (ref 12.0–15.0)
MCH: 30.5 pg (ref 26.0–34.0)
MCHC: 32.3 g/dL (ref 30.0–36.0)
MCV: 94.5 fL (ref 78.0–100.0)
PLATELETS: 167 10*3/uL (ref 150–400)
RBC: 3.11 MIL/uL — AB (ref 3.87–5.11)
RDW: 14.3 % (ref 11.5–15.5)
WBC: 9.8 10*3/uL (ref 4.0–10.5)

## 2015-06-28 NOTE — Progress Notes (Signed)
POSTOPERATIVE DAY # 1 S/P cesarean section  S:         Reports feeling well             Tolerating po intake / no nausea / no vomiting / no flatus / no BM             Bleeding is light             Pain controlled with motrin and percocet             Up ad lib / ambulatory/ voiding QS  Newborn breast feeding  / Circumcision planned today with Dr Ursula Beath:  VS: BP 98/54 mmHg  Pulse 79  Temp(Src) 97.8 F (36.6 C) (Oral)  Resp 18  SpO2 98%  LMP 09/23/2014 (Exact Date)  Breastfeeding? Unknown   LABS:               Recent Labs  06/25/15 1030 06/28/15 0545  WBC 8.9 9.8  HGB 11.5* 9.5*  PLT 229 167               Bloodtype: --/--/O POS (01/25 1030)  Rubella: Immune (06/30 0000)                                             I&O: Intake/Output      01/27 0701 - 01/28 0700 01/28 0701 - 01/29 0700   P.O. 1020 240   I.V. 2800    Total Intake 3820 240   Urine 2000    Blood 1000    Total Output 3000     Net +820 +240                     Physical Exam:             Alert and Oriented X3  Lungs: Clear and unlabored  Heart: regular rate and rhythm / no mumurs  Abdomen: soft, non-tender, non-distended with active BS             Fundus: firm, non-tender, U-1             Dressing intact without drainage              Incision:  approximated with suture / no erythema / no ecchymosis / no drainage  Perineum: intact  Lochia: light  Extremities: no edema, no calf pain or tenderness  A:        POD # 1 S/P CS            Mild ABL anemia - postop  P:        Routine postoperative care                Tracy Bradshaw CNM, MSN, [facility] 06/28/2015, 9:09 AM

## 2015-06-28 NOTE — Anesthesia Postprocedure Evaluation (Signed)
Anesthesia Post Note  Patient: Tracy Bradshaw  Procedure(s) Performed: Procedure(s) (LRB): Repeat CESAREAN SECTION (N/A)  Patient location during evaluation: Mother Baby Anesthesia Type: Spinal Level of consciousness: awake Pain management: satisfactory to patient Vital Signs Assessment: post-procedure vital signs reviewed and stable Respiratory status: spontaneous breathing Cardiovascular status: stable Anesthetic complications: no    Last Vitals:  Filed Vitals:   06/27/15 2323 06/28/15 0330  BP: 111/62 103/55  Pulse: 86 82  Temp: 37.2 C 36.8 C  Resp: 18 18    Last Pain:  Filed Vitals:   06/28/15 0622  PainSc: 0-No pain                 Sharesa Kemp

## 2015-06-28 NOTE — Addendum Note (Signed)
Addendum  created 06/28/15 [id number] by Asher Muir, CRNA   Modules edited: Clinical Notes   Clinical Notes:  File: [id number]

## 2015-06-29 MED ORDER — ACETAMINOPHEN 325 MG PO TABS: 650.0000 mg | ORAL_TABLET | ORAL | Status: AC | PRN

## 2015-06-29 MED ORDER — IBUPROFEN 600 MG PO TABS: 600.0000 mg | ORAL_TABLET | Freq: Four times a day (QID) | ORAL | Status: DC

## 2015-06-29 NOTE — Progress Notes (Signed)
POSTOPERATIVE DAY # 2 S/P CS- previous CS/ elective repeat  S:         Reports feeling well & desires DC home             Tolerating po intake / no nausea / no vomiting / + flatus / no BM             Bleeding is light             Pain controlled with motrin and Tylenol             Up ad lib / ambulatory/ voiding QS  Newborn breast feeding    O:  VS: BP 107/60 mmHg  Pulse 66  Temp(Src) 98.4 F (36.9 C) (Oral)  Resp 18  SpO2 97%  LMP 09/23/2014 (Exact Date)  Breastfeeding? Unknown   LABS:               Recent Labs  06/28/15 0545  WBC 9.8  HGB 9.5*  PLT 167               Bloodtype: --/--/O POS (01/25 1030)  Rubella: Immune (06/30 0000)                                             I&O: Intake/Output      01/28 0701 - 01/29 0700 01/29 0701 - 01/30 0700   P.O. 720    I.V.     Total Intake 720     Urine 975    Blood     Total Output 975     Net -255                     Physical Exam:             Alert and Oriented X3  Lungs: Clear and unlabored  Heart: regular rate and rhythm / no mumurs  Abdomen: soft, non-tender, non-distended, active BS             Fundus: firm, non-tender, Ueven             Dressing intact              Incision:  approximated with suture / no erythema / no ecchymosis / no drainage  Perineum: intact  Lochia: light  Extremities: trace edema, no calf pain or tenderness, negative Homans  A:        POD # 2 S/P repeat CS             P:        Routine postoperative care              DC home - [facility] booklet - instructions reviewed   Artelia Laroche CNM, MSN, FACNM 06/29/2015, 7:58 AM

## 2015-06-29 NOTE — Discharge Summary (Signed)
  POSTOPERATIVE DISCHARGE SUMMARY:  Patient ID: Tracy Bradshaw MRN: [id number] DOB/AGE: 1976/01/17 40 y.o.  Admit date: 06/27/2015 Admission Diagnoses: 39 weeks / previous cesarean section  Discharge date:  06/29/2015 Discharge Diagnoses: POD 2 s/p repeat elective cesarean section  Prenatal history: [id number]   EDC : 06/30/2015, by Last Menstrual Period  Prenatal care at [facility]  Primary provider : Pamala Hurry Prenatal course complicated by previous CS  Prenatal Labs: ABO, Rh: --/--/O POS (01/25 1030)  Antibody: NEG (01/25 1030) Rubella: Immune (06/30 0000)   RPR: Non Reactive (01/25 1030)  HBsAg: Negative (06/30 0000)  HIV: Non-reactive (06/30 0000)   Medical / Surgical History :  Past medical history:  Past Medical History  Diagnosis Date  . SAB (spontaneous abortion)   . Blood type O+   . Fibroid   . Abnormal Pap smear     HPV  . Cesarean delivery delivered (arrest of descent - 11/30) 04/29/2012  . Acute blood loss anemia 04/30/2012  . Anemia of mother in pregnancy, delivered 05/02/2012  . PONV (postoperative nausea and vomiting)   . Postpartum care following cesarean delivery (1/27) 06/27/2015    Past surgical history:  Past Surgical History  Procedure Laterality Date  . No past surgeries    . Cesarean section  04/29/2012    Procedure: CESAREAN SECTION;  Surgeon: Elveria Royals, MD;  Location: [facility];  Service: Obstetrics;  Laterality: N/A;    Family History:  Family History  Problem Relation Age of Onset  . Hypertension Mother   . Heart disease Father   . Other Neg Hx     Social History:  reports that she has never smoked. She has never used smokeless tobacco. She reports that she drinks alcohol. She reports that she does not use illicit drugs.  Allergies: Review of patient's allergies indicates no known allergies.   Current Medications at time of admission:  Prior to Admission medications   Medication Sig Start Date End Date Taking?  Authorizing Provider  Prenatal Vit-Fe Fumarate-FA (PRENATAL MULTIVITAMIN) TABS Take 1 tablet by mouth every evening.   Yes Historical Provider, MD   Procedures: Cesarean section delivery on 06/27/2015 with delivery of female  newborn by Dr Pamala Hurry   See operative report for further details APGAR (1 MIN): 9   APGAR (5 MINS): 9    Postoperative / postpartum course:  Uncomplicated with discharge on POD 2  Discharge Instructions:  Discharged Condition: stable  Activity: pelvic rest and postoperative restrictions x 2   Diet: routine  Medications:    Medication List    TAKE these medications        acetaminophen 325 MG tablet  Commonly known as:  TYLENOL  Take 2 tablets (650 mg total) by mouth every 4 (four) hours as needed (for pain scale < 4).     ibuprofen 600 MG tablet  Commonly known as:  ADVIL,MOTRIN  Take 1 tablet (600 mg total) by mouth every 6 (six) hours.     prenatal multivitamin Tabs tablet  Take 1 tablet by mouth every evening.        Wound Care: keep clean and dry / remove honeycomb POD 5 Postpartum Instructions: [facility] discharge booklet - instructions reviewed  Discharge to: Home  Follow up :   [facility] in 6 weeks for routine postpartum visit with Dr Pamala Hurry                Signed: Artelia Laroche CNM, MSN, [facility] 06/29/2015, 8:26 AM

## 2015-06-30 ENCOUNTER — Encounter (HOSPITAL_COMMUNITY): Payer: Self-pay | Admitting: Obstetrics

## 2016-12-21 DIAGNOSIS — Z01419 Encounter for gynecological examination (general) (routine) without abnormal findings: Secondary | ICD-10-CM | POA: Diagnosis not present

## 2016-12-21 DIAGNOSIS — Z1329 Encounter for screening for other suspected endocrine disorder: Secondary | ICD-10-CM | POA: Diagnosis not present

## 2016-12-21 DIAGNOSIS — Z0000 Encounter for general adult medical examination without abnormal findings: Secondary | ICD-10-CM | POA: Diagnosis not present

## 2016-12-21 DIAGNOSIS — Z6823 Body mass index (BMI) 23.0-23.9, adult: Secondary | ICD-10-CM | POA: Diagnosis not present

## 2016-12-21 DIAGNOSIS — Z1159 Encounter for screening for other viral diseases: Secondary | ICD-10-CM | POA: Diagnosis not present

## 2017-05-05 DIAGNOSIS — L03012 Cellulitis of left finger: Secondary | ICD-10-CM | POA: Diagnosis not present

## 2017-05-06 ENCOUNTER — Ambulatory Visit: Payer: Self-pay | Admitting: Emergency Medicine

## 2017-05-07 ENCOUNTER — Emergency Department (HOSPITAL_COMMUNITY)
Admission: EM | Admit: 2017-05-07 | Discharge: 2017-05-07 | Disposition: A | Discharge status: Home / Self Care | Payer: 59 | Attending: Emergency Medicine | Admitting: Emergency Medicine

## 2017-05-07 ENCOUNTER — Emergency Department (HOSPITAL_COMMUNITY): Payer: 59

## 2017-05-07 ENCOUNTER — Encounter (HOSPITAL_COMMUNITY): Payer: Self-pay | Admitting: Emergency Medicine

## 2017-05-07 ENCOUNTER — Other Ambulatory Visit: Payer: Self-pay

## 2017-05-07 DIAGNOSIS — L03011 Cellulitis of right finger: Secondary | ICD-10-CM | POA: Insufficient documentation

## 2017-05-07 DIAGNOSIS — R600 Localized edema: Secondary | ICD-10-CM | POA: Diagnosis not present

## 2017-05-07 DIAGNOSIS — S60121A Contusion of right index finger with damage to nail, initial encounter: Secondary | ICD-10-CM | POA: Diagnosis not present

## 2017-05-07 DIAGNOSIS — Z79899 Other long term (current) drug therapy: Secondary | ICD-10-CM | POA: Diagnosis not present

## 2017-05-07 MED ORDER — CEPHALEXIN 500 MG PO CAPS: 500.0000 mg | ORAL_CAPSULE | Freq: Four times a day (QID) | ORAL | 0 refills | Status: DC

## 2017-05-07 MED ORDER — CEPHALEXIN 500 MG PO CAPS
500.0000 mg | ORAL_CAPSULE | Freq: Once | ORAL | Status: AC
  Administered 2017-05-07: 500 mg via ORAL
  Filled 2017-05-07: qty 1

## 2017-05-07 MED ORDER — IBUPROFEN 800 MG PO TABS
800.0000 mg | ORAL_TABLET | Freq: Once | ORAL | Status: AC
  Administered 2017-05-07: 800 mg via ORAL
  Filled 2017-05-07: qty 1

## 2017-05-07 NOTE — Discharge Instructions (Signed)
We have drained the infection in your finger and packed it. You will need to follow up in 2 days for wound check and packing removal. Return sooner for any problems.  Continue to take the Bactrim in addition to the medication we give you.

## 2017-05-07 NOTE — ED Provider Notes (Signed)
[facility] DEPT Provider Note   CSN: [id number] Arrival date & time: 05/07/17  1629     History   Chief Complaint Chief Complaint  Patient presents with  . Hand Pain    HPI Tracy Bradshaw is a 41 y.o. female who presents to the ED with pain, redness and swelling to the right index finger with small amount of drainage near the nailbed. [facility] and was prescribed Bactrim. Patient was to f/u with ortho and she did call but the appointment they gave her is for 05/12/17 and she felt like she could not wait. Patient reports that when was a [facility] she told that she had pulled a hang nail, and they said "oh, that is a paronychia" and referred her to ortho. Patient denies fever, chills, or any other problems.   HPI  Past Medical History:  Diagnosis Date  . Abnormal Pap smear    HPV  . Acute blood loss anemia 04/30/2012  . Anemia of mother in pregnancy, delivered 05/02/2012  . Blood type O+   . Cesarean delivery delivered (arrest of descent - 11/30) 04/29/2012  . Fibroid   . PONV (postoperative nausea and vomiting)   . Postpartum care following cesarean delivery (1/27) 06/27/2015  . SAB (spontaneous abortion)     Patient Active Problem List   Diagnosis Date Noted  . Postpartum care following cesarean delivery (1/27) 06/27/2015  . Status post repeat low transverse cesarean section 06/27/2015  . Uterine fibroid 04/06/2011  . 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 CESAREAN SECTION  04/29/2012   Procedure: CESAREAN SECTION;  Surgeon: Elveria Royals, MD;  Location: [facility];  Service: Obstetrics;  Laterality: N/A;  . CESAREAN SECTION N/A 06/27/2015   Procedure: Repeat CESAREAN SECTION;  Surgeon: Aloha Gell, MD;  Location: [facility];  Service: Obstetrics;  Laterality: N/A;  EDD: 06/30/15  . NO PAST SURGERIES      OB History    Gravida Para Term Preterm AB Living   3 2  2   1 2    SAB TAB Ectopic Multiple Live Births   1     0 2       Home Medications    Prior to Admission medications   Medication Sig Start Date End Date Taking? Authorizing Provider  acetaminophen (TYLENOL) 325 MG tablet Take 2 tablets (650 mg total) by mouth every 4 (four) hours as needed (for pain scale < 4). 06/29/15   Artelia Laroche, CNM  cephALEXin (KEFLEX) 500 MG capsule Take 1 capsule (500 mg total) by mouth 4 (four) times daily. 05/07/17   Ashley Murrain, NP  ibuprofen (ADVIL,MOTRIN) 600 MG tablet Take 1 tablet (600 mg total) by mouth every 6 (six) hours. 06/29/15   Artelia Laroche, CNM  Prenatal Vit-Fe Fumarate-FA (PRENATAL MULTIVITAMIN) TABS Take 1 tablet by mouth every evening.    [provider]    Family History Family History  Problem Relation Age of Onset  . Hypertension Mother   . Heart disease Father   . Other Neg Hx     Social History Social History   Tobacco Use  . Smoking status: Never Smoker  . Smokeless tobacco: Never Used  Substance Use Topics  . Alcohol use: Yes    Comment: OCCASIONALLY, none since pregnancy  . Drug use: No     Allergies   Patient has no known allergies.   Review of Systems Review  of Systems  Musculoskeletal: Positive for arthralgias.  Skin: Positive for wound.  All other systems reviewed and are negative.    Physical Exam Updated Vital Signs BP 129/80 (BP Location: Left Arm)   Pulse 89   Temp 98.7 F (37.1 C) (Oral)   Resp 18   LMP 04/22/2017   SpO2 98%   Physical Exam  Constitutional: She appears well-developed and well-nourished. No distress.  Eyes: EOM are normal.  Neck: Neck supple.  Cardiovascular: Normal rate.  Pulmonary/Chest: Effort normal.  Musculoskeletal:       Right hand: She exhibits swelling. She exhibits normal range of motion. Normal sensation noted. Normal strength noted. She exhibits no thumb/finger opposition.  Right index finger with swelling surrounding the nail c/w paronychia. Tender  on exam. No red streaking.  Neurological: She is alert.  Skin: Skin is warm and dry.  Psychiatric: She has a normal mood and affect.  Nursing note and vitals reviewed.    ED Treatments / Results  Labs (all labs ordered are listed, but only abnormal results are displayed) Labs Reviewed - No data to display  Radiology Dg Finger Index Right  Result Date: 05/07/2017 CLINICAL DATA:  Swelling and bruising following skin injury EXAM: RIGHT INDEX FINGER 2+V COMPARISON:  None. FINDINGS: Considerable soft tissue swelling is noted along the distal aspect of the second digit. No underlying bony abnormality is seen. IMPRESSION: Soft tissue swelling without acute bony abnormality. Electronically Signed   By: Inez Catalina M.D.   On: 05/07/2017 20:02    Procedures .Marland KitchenIncision and Drainage Date/Time: 05/07/2017 9:12 PM Performed by: Ashley Murrain, NP Authorized by: Ashley Murrain, NP   Consent:    Consent obtained:  Verbal   Consent given by:  Patient   Risks discussed:  Bleeding and incomplete drainage   Alternatives discussed:  Alternative treatment Location:    Indications for incision and drainage: paronychia right index finger.   Location:  Upper extremity   Upper extremity location:  Finger   Finger location:  R index finger Pre-procedure details:    Skin preparation:  Betadine Anesthesia (see MAR for exact dosages):    Anesthesia method:  Nerve block   Block anesthetic:  Lidocaine 1% w/o epi   Block injection procedure:  Anatomic landmarks identified, introduced needle, incremental injection and negative aspiration for blood   Block outcome:  Anesthesia achieved Procedure type:    Complexity:  Complex Procedure details:    Needle aspiration: no     Incision types:  Single straight   Incision depth:  Dermal   Scalpel blade:  11   Wound management:  Probed and deloculated and irrigated with saline   Drainage:  Purulent   Drainage amount:  Moderate   Packing materials:  1/4 in  iodoform gauze Post-procedure details:    Patient tolerance of procedure:  Tolerated well, no immediate complications   (including critical care time)  Medications Ordered in ED Medications  cephALEXin (KEFLEX) capsule 500 mg (not administered)  ibuprofen (ADVIL,MOTRIN) tablet 800 mg (not administered)     Initial Impression / Assessment and Plan / ED Course  I have reviewed the triage vital signs and the nursing notes. 41 y.o. female with pain and swelling to the right index finger due to paronychia. Stable for d/c without osteo noted on x-ray and no fever or red streaking or other signs of systemic infection. Will add Keflex to the Bactrim she is taking. Patient to f/u in 2 days for wound check and packing  removal or sooner for any problems.  Final Clinical Impressions(s) / ED Diagnoses   Final diagnoses:  Paronychia of right index finger    ED Discharge Orders        Ordered    cephALEXin (KEFLEX) 500 MG capsule  4 times daily     05/07/17 2108       Debroah Baller [place], [place] 05/07/17 2133    Jola Schmidt, MD 05/07/17 2328

## 2017-05-07 NOTE — ED Triage Notes (Signed)
Patient here with complaints of right pointer finger pain. Redness and swelling noted near nail bed. Reports drainage.

## 2017-05-10 DIAGNOSIS — L03011 Cellulitis of right finger: Secondary | ICD-10-CM | POA: Diagnosis not present

## 2017-09-28 DIAGNOSIS — R1032 Left lower quadrant pain: Secondary | ICD-10-CM | POA: Diagnosis not present

## 2017-10-03 DIAGNOSIS — R1032 Left lower quadrant pain: Secondary | ICD-10-CM | POA: Diagnosis not present

## 2017-10-23 DIAGNOSIS — J209 Acute bronchitis, unspecified: Secondary | ICD-10-CM | POA: Diagnosis not present

## 2017-10-29 DIAGNOSIS — J209 Acute bronchitis, unspecified: Secondary | ICD-10-CM | POA: Diagnosis not present

## 2017-12-22 DIAGNOSIS — Z6824 Body mass index (BMI) 24.0-24.9, adult: Secondary | ICD-10-CM | POA: Diagnosis not present

## 2017-12-22 DIAGNOSIS — Z118 Encounter for screening for other infectious and parasitic diseases: Secondary | ICD-10-CM | POA: Diagnosis not present

## 2017-12-22 DIAGNOSIS — Z01419 Encounter for gynecological examination (general) (routine) without abnormal findings: Secondary | ICD-10-CM | POA: Diagnosis not present

## 2018-07-18 DIAGNOSIS — R6889 Other general symptoms and signs: Secondary | ICD-10-CM | POA: Diagnosis not present

## 2018-07-18 DIAGNOSIS — B349 Viral infection, unspecified: Secondary | ICD-10-CM | POA: Diagnosis not present

## 2018-07-20 DIAGNOSIS — J209 Acute bronchitis, unspecified: Secondary | ICD-10-CM | POA: Diagnosis not present

## 2018-07-20 DIAGNOSIS — J040 Acute laryngitis: Secondary | ICD-10-CM | POA: Diagnosis not present

## 2018-07-25 ENCOUNTER — Encounter: Payer: Self-pay | Admitting: Family Medicine

## 2018-07-25 ENCOUNTER — Ambulatory Visit: Payer: 59 | Admitting: Family Medicine

## 2018-07-25 VITALS — BP 110/88 | HR 87 | Temp 98.5°F | Ht 61.0 in | Wt 128.8 lb

## 2018-07-25 DIAGNOSIS — R05 Cough: Secondary | ICD-10-CM | POA: Diagnosis not present

## 2018-07-25 DIAGNOSIS — R059 Cough, unspecified: Secondary | ICD-10-CM

## 2018-07-25 DIAGNOSIS — J329 Chronic sinusitis, unspecified: Secondary | ICD-10-CM | POA: Diagnosis not present

## 2018-07-25 MED ORDER — IPRATROPIUM BROMIDE 0.06 % NA SOLN: 2.0000 | Freq: Four times a day (QID) | NASAL | 0 refills | Status: DC

## 2018-07-25 MED ORDER — AZITHROMYCIN 250 MG PO TABS
ORAL_TABLET | ORAL | 0 refills | Status: DC
Start: 2018-07-25 — End: 2019-04-13

## 2018-07-25 MED ORDER — METHYLPREDNISOLONE ACETATE 80 MG/ML IJ SUSP
80.0000 mg | Freq: Once | INTRAMUSCULAR | Status: AC
  Administered 2018-07-25: 80 mg via INTRAMUSCULAR

## 2018-07-25 NOTE — Progress Notes (Signed)
Chief Complaint:  Tracy Bradshaw is a 43 y.o. female who presents today with a chief complaint of cough and to establish care.   Assessment/Plan:  Cough / Sinusitis We will start course of vancomycin given that symptoms have been persistent for 1 week and are worsening.  Also start Atrovent nasal spray.  Continue Tessalon.  Will give 80 mg of IM Depo-Medrol today.  Discussed reasons to return to care.  Follow-up as needed.  Preventative Healthcare Patient was instructed to return soon for CPE.  She follows at [facility]. Health Maintenance Due  Topic Date Due  . MAMMOGRAM  09/24/1992  . PAP SMEAR-Modifier  02/26/2012     Subjective:  HPI:  Cough Started over a week ago. Worsening over that time. Associated with fevers, chills, body aches, and malaise. Children have been sick with similar symptoms.  Here for she reportedly had a negative flu and strep test done.  She was started on Tessalon which has not helped.  Symptoms have progressed over that time.  She is having more productive cough.  Increasing malaise and body aches.  No other treatments tried. No other obvious alleviating or aggravating factors.   ROS: Per HPI, otherwise a complete review of systems was negative.   PMH:  The following were reviewed and entered/updated in epic: Past Medical History:  Diagnosis Date  . Abnormal Pap smear    HPV  . Acute blood loss anemia 04/30/2012  . Anemia of mother in pregnancy, delivered 05/02/2012  . Blood type O+   . Cesarean delivery delivered (arrest of descent - 11/30) 04/29/2012  . Fibroid   . PONV (postoperative nausea and vomiting)   . Postpartum care following cesarean delivery (1/27) 06/27/2015  . SAB (spontaneous abortion)   . Uterine fibroid 04/06/2011   Submucus    There are no active problems to display for this patient.  Past Surgical History:  Procedure Laterality Date  . CESAREAN SECTION  04/29/2012   Procedure: CESAREAN SECTION;  Surgeon:  Elveria Royals, MD;  Location: [facility];  Service: Obstetrics;  Laterality: N/A;  . CESAREAN SECTION N/A 06/27/2015   Procedure: Repeat CESAREAN SECTION;  Surgeon: Aloha Gell, MD;  Location: [facility];  Service: Obstetrics;  Laterality: N/A;  EDD: 06/30/15    Family History  Problem Relation Age of Onset  . Hypertension Mother   . Heart disease Father   . Other Neg Hx     Medications- reviewed and updated Current Outpatient Medications  Medication Sig Dispense Refill  . acetaminophen (TYLENOL) 325 MG tablet Take 2 tablets (650 mg total) by mouth every 4 (four) hours as needed (for pain scale < 4).    . albuterol (PROVENTIL HFA;VENTOLIN HFA) 108 (90 Base) MCG/ACT inhaler Inhale into the lungs.    . benzonatate (TESSALON) 200 MG capsule TAKE 1 CAPSULE BY MOUTH THREE TIMES DAILY FOR COUGH    . ibuprofen (ADVIL,MOTRIN) 600 MG tablet Take 1 tablet (600 mg total) by mouth every 6 (six) hours. 30 tablet 0  . azithromycin (ZITHROMAX) 250 MG tablet Take 2 tabs day 1, then 1 tab daily 6 each 0  . ipratropium (ATROVENT) 0.06 % nasal spray Place 2 sprays into both nostrils 4 (four) times daily. 15 mL 0   No current facility-administered medications for this visit.     Allergies-reviewed and updated No Known Allergies  Social History   Socioeconomic History  . Marital status: Married    Spouse name: Not on file  .  Number of children: Not on file  . Years of education: Not on file  . Highest education level: Not on file  Occupational History  . Not on file  Social Needs  . Financial resource strain: Not on file  . Food insecurity:    Worry: Not on file    Inability: Not on file  . Transportation needs:    Medical: Not on file    Non-medical: Not on file  Tobacco Use  . Smoking status: Never Smoker  . Smokeless tobacco: Never Used  Substance and Sexual Activity  . Alcohol use: Not Currently    Comment: OCCASIONALLY, none since pregnancy  . Drug use: No  . Sexual activity: Not on  file  Lifestyle  . Physical activity:    Days per week: Not on file    Minutes per session: Not on file  . Stress: Not on file  Relationships  . Social connections:    Talks on phone: Not on file    Gets together: Not on file    Attends religious service: Not on file    Active member of club or organization: Not on file    Attends meetings of clubs or organizations: Not on file    Relationship status: Not on file  Other Topics Concern  . Not on file  Social History Narrative  . Not on file         Objective:  Physical Exam: BP 110/88 (BP Location: Left Arm, Patient Position: Sitting, Cuff Size: Normal)   Pulse 87   Temp 98.5 F (36.9 C) (Oral)   Ht 5\' 1"  (1.549 m)   Wt 128 lb 12.8 oz (58.4 kg)   SpO2 98%   BMI 24.34 kg/m   Gen: NAD, resting comfortably HEENT: Right TM with clear effusion.  Left TM clear.  OP erythematous.  Nose mucosa erythematous and boggy bilaterally. CV: Regular rate and rhythm with no murmurs appreciated Pulm: Normal work of breathing, scattered rhonchi and wheezes at bilateral bases. GI: Normal bowel sounds present. Soft, Nontender, Nondistended. MSK: No edema, cyanosis, or clubbing noted Skin: Warm, dry Neuro: Grossly normal, moves all extremities Psych: Normal affect and thought content      M. Jerline Pain, MD 07/25/2018 10:58 AM

## 2018-07-25 NOTE — Patient Instructions (Addendum)
Start the zpack. Use the tessalon and atrovent.   We will give you a shot of an anti-inflammatory today.   Please stay well hydrated.  You can take tylenol and/or motrin as needed for low grade fever and pain.  Please let me know if your symptoms worsen or fail to improve.  Take care, Dr Jerline Pain

## 2018-07-25 NOTE — Addendum Note (Signed)
Addended by: Elmer Bales on: 07/25/2018 11:37 AM   Modules accepted: Orders

## 2019-01-22 LAB — HM MAMMOGRAPHY

## 2019-01-24 ENCOUNTER — Encounter: Payer: Self-pay | Admitting: Family Medicine

## 2019-03-22 ENCOUNTER — Telehealth: Payer: Self-pay

## 2019-03-22 ENCOUNTER — Encounter (HOSPITAL_COMMUNITY): Payer: Self-pay | Admitting: Emergency Medicine

## 2019-03-22 ENCOUNTER — Emergency Department (HOSPITAL_COMMUNITY)
Admission: EM | Admit: 2019-03-22 | Discharge: 2019-03-22 | Disposition: A | Discharge status: Home / Self Care | Payer: 59 | Attending: Emergency Medicine | Admitting: Emergency Medicine

## 2019-03-22 ENCOUNTER — Ambulatory Visit: Payer: Self-pay | Admitting: *Deleted

## 2019-03-22 DIAGNOSIS — R202 Paresthesia of skin: Secondary | ICD-10-CM | POA: Insufficient documentation

## 2019-03-22 DIAGNOSIS — Z5321 Procedure and treatment not carried out due to patient leaving prior to being seen by health care provider: Secondary | ICD-10-CM | POA: Diagnosis not present

## 2019-03-22 NOTE — Telephone Encounter (Signed)
Patient is currently in the ED.

## 2019-03-22 NOTE — Telephone Encounter (Signed)
° °  Pt call to say she left the ER and now is feeling better and would like to have the CT done just to see what may have happen to her

## 2019-03-22 NOTE — Telephone Encounter (Signed)
See note

## 2019-03-22 NOTE — ED Triage Notes (Signed)
Reports intermittent bilat arm and leg and face tingling for months.

## 2019-03-22 NOTE — Telephone Encounter (Signed)
Pt called in concerned about her heart beating fast last night and felt like she could not breath.   She also experienced numbness in both hands with dizziness.  See triage notes.  While I was wrapping up the call she mentioned she was feeling a tingling on one side of her face that just started.   I had her look in the mirror and tell me if she noticed drooping on that side of her face.   She said,  "No, I don't see any change".   Due to this change and with the symptoms she had last night I have referred her to the ED.    She said she has someone to go with her.   I instructed her to go on to the ED so she has chosen to go to [facility].  I sent these notes to Dr. Marigene Ehlers office so he would be aware of the ED referral.    Reason for Disposition . [1] Numbness (i.e., loss of sensation) of the face, arm / hand, or leg / foot on one side of the body AND [2] gradual onset (e.g., days to weeks) AND [3] present now  Answer Assessment - Initial Assessment Questions 1. SYMPTOM: "What is the main symptom you are concerned about?" (e.g., weakness, numbness)     Last night my heart started pumping quickly and I felt like I couldn't breath and my hands were numb.  I got dizzy. Now I have pressure in my neck and back of my head.   The top of my head I feel pain.    I feel like maybe a weakness.  I'm walking ok.  I feel like the blood is rushing in my body.    It's hard to describe. I just saw my GYN so I called her to see if my blood work looked ok.   They haven't call me back.   I called my GYN again this morning for my blood work results.   The tingling was in both hands last night.  They felt numb.   This morning my hands feel fine. A month ago I felt like a pinch in my left  arm it goes numb a minute then it's gone.    2. ONSET: "When did this start?" (minutes, hours, days; while sleeping)     Last night  3. LAST NORMAL: "When was the last time you were normal (no symptoms)?"     This  morning. Sometimes I feel like my heart hurts.  I feel a pressure.   This didn't happen last night.  Last time I felt this was 3 days ago.   It felt like a pinch then a pressure.   It lasted a few seconds.    No history of heart problems.  Right now my right hand is tingling a little bit.   I can use it.   4. PATTERN "Does this come and go, or has it been constant since it started?"  "Is it present now?"     Intermittent.   Happened last night and it went away and now it's happening again. 5. CARDIAC SYMPTOMS: "Have you had any of the following symptoms: chest pain, difficulty breathing, palpitations?"     See above.   6. NEUROLOGIC SYMPTOMS: "Have you had any of the following symptoms: headache, dizziness, vision loss, double vision, changes in speech, unsteady on your feet?"     See above.     No visual changes.  7. OTHER SYMPTOMS: "Do you have any other symptoms?"     See above.     The right of my face is tingling.   Just started now.   I've felt this before 3 months ago.   Had her look in mirror.   No changes in facial appearance. 8. PREGNANCY: "Is there any chance you are pregnant?" "When was your last menstrual period?"     No  Protocols used: NEUROLOGIC DEFICIT-A-AH

## 2019-03-22 NOTE — Telephone Encounter (Signed)
Copied from [place] [id number]. Topic: General - Other >> Mar 22, 2019 12:39 PM Alanda Slim E wrote: Reason for CRM:Pt was sent to the ER/ Pt called to speak with nurse about CT scan/ Pt was waiting at ER for a while and wanted to speak with nurse about an order or schedule for a CT scan somewhere else / please advise

## 2019-03-23 NOTE — Telephone Encounter (Signed)
Left voice message for patient to call clinic.  

## 2019-03-26 NOTE — Telephone Encounter (Signed)
Patient notified and voices understanding 

## 2019-03-26 NOTE — Telephone Encounter (Signed)
See note

## 2019-03-26 NOTE — Telephone Encounter (Signed)
Patient called in returning a call from [facility]. Please advise.

## 2019-03-26 NOTE — Telephone Encounter (Signed)
I cannot order a CT scan for something I have not personally evaluated patient for. Recommend UC or ED evaluation today.  Algis Greenhouse. Jerline Pain, MD 03/26/2019 3:09 PM

## 2019-03-26 NOTE — Telephone Encounter (Signed)
Patient is having sharp pain and chest pressure intermittently,patient left ER due to the wait was long.Informed patient she needs an OV. Patient wants a referral to have a CT Scan.Please advise

## 2019-04-06 ENCOUNTER — Other Ambulatory Visit: Payer: Self-pay

## 2019-04-06 ENCOUNTER — Telehealth: Payer: Self-pay | Admitting: Family Medicine

## 2019-04-06 DIAGNOSIS — R200 Anesthesia of skin: Secondary | ICD-10-CM

## 2019-04-06 DIAGNOSIS — R000 Tachycardia, unspecified: Secondary | ICD-10-CM

## 2019-04-06 NOTE — Telephone Encounter (Signed)
See below

## 2019-04-06 NOTE — Telephone Encounter (Signed)
Please advise.  Ok to place referral?

## 2019-04-06 NOTE — Telephone Encounter (Signed)
Referral has been placed. Patient has been notified. 

## 2019-04-06 NOTE — Telephone Encounter (Signed)
Copied from [place] [id number]. Topic: Referral - Request for Referral >> Apr 06, 2019 11:12 AM Leward Quan A wrote: Has patient seen PCP for this complaint? Yes.   *If NO, is insurance requiring patient see PCP for this issue before PCP can refer them? Referral for which specialty: Cardiologist  Preferred provider/office: none chosen  Reason for referral: Issues with heart racing and numbness in hands

## 2019-04-06 NOTE — Telephone Encounter (Signed)
[place] with referral but recommend she schedule a visit here if has an episode before cardiology referral.  Algis Greenhouse. Jerline Pain, MD 04/06/2019 12:57 PM

## 2019-04-12 NOTE — Progress Notes (Signed)
Cardiology Office Note   Date:  04/13/2019   ID:  [name], DOB 05/14/1976, MRN [id number]  PCP:  Vivi Barrack, MD  Cardiologist:   No primary care provider on file. Referring:  Vivi Barrack, MD  Chief Complaint  Patient presents with  . Palpitations      History of Present Illness: Tracy Bradshaw is a 44 y.o. female who presents for tachycardia and chest pain.  The patient has no past cardiac history.  She presents because over the past six months she has had pinching in her chest.  It comes and goes.  Sporadic.  Follow blood pressure.  She will feel her heart racing.  She had one episode about 3 weeks ago when her heart was racing.  Her hands went numb.  She did not feel lightheaded.  It only lasted for a few seconds.  She was deep breathing to try to improve it.  Her doctors have thought maybe there was some anxiety or panic.  She cannot bring on any episodes of chest pressure or rapid heart rate.  She cannot bring on the pinching sensation.  She does walk and run for exercise.  She is never had any prior cardiac history.  Interestingly her father died in his 39s of a heart attack but he did not go to the doctor very much.  He apparently had siblings who had early heart disease.  Her brother has an enlarged heart.  I made her call her brother who is in [place] to try to get more details on this.  It sounds like he had hyperthyroidism and may have a cardiomyopathy related to this.   Past Medical History:  Diagnosis Date  . Abnormal Pap smear    HPV  . Acute blood loss anemia 04/30/2012  . Anemia of mother in pregnancy, delivered 05/02/2012  . Blood type O+   . Cesarean delivery delivered (arrest of descent - 11/30) 04/29/2012  . Fibroid   . PONV (postoperative nausea and vomiting)   . Postpartum care following cesarean delivery (1/27) 06/27/2015  . SAB (spontaneous abortion)   . Uterine fibroid 04/06/2011   Submucus     Past Surgical History:  Procedure Laterality Date   . CESAREAN SECTION  04/29/2012   Procedure: CESAREAN SECTION;  Surgeon: Elveria Royals, MD;  Location: [facility];  Service: Obstetrics;  Laterality: N/A;  . CESAREAN SECTION N/A 06/27/2015   Procedure: Repeat CESAREAN SECTION;  Surgeon: Aloha Gell, MD;  Location: [facility];  Service: Obstetrics;  Laterality: N/A;  EDD: 06/30/15     Current Outpatient Medications  Medication Sig Dispense Refill  . acetaminophen (TYLENOL) 325 MG tablet Take 2 tablets (650 mg total) by mouth every 4 (four) hours as needed (for pain scale < 4).    . albuterol (PROVENTIL HFA;VENTOLIN HFA) 108 (90 Base) MCG/ACT inhaler Inhale 1-2 puffs into the lungs as needed.     . Ascorbic Acid (VITAMIN C GUMMIES PO) Take by mouth. Chew 3 gummies daily    . ibuprofen (ADVIL) 600 MG tablet Take 600 mg by mouth as needed.    . Multiple Vitamins-Minerals (MULTIVITAMIN WITH MINERALS) tablet Take 1 tablet by mouth daily.    Marland Kitchen SALINE NASAL MIST NA Place into the nose as needed.     No current facility-administered medications for this visit.     Allergies:   Patient has no known allergies.    Social History:  The patient  reports that she has never smoked.  She has never used smokeless tobacco. She reports previous alcohol use. She reports that she does not use drugs.   Family History:  The patient's family history includes Heart disease (age of onset: 15) in her brother; Heart disease (age of onset: 77) in her father; Hypertension in her mother.  Her brother has heart failure.  It sounds like however after calling him in [place] if this might be related to hyperthyroidism.   ROS:  Please see the history of present illness.   Otherwise, review of systems are positive for positive for bilateral lower leg numbness.   All other systems are reviewed and negative.    PHYSICAL EXAM: VS:  BP 124/80   Pulse 97   Temp (!) 97.1 F (36.2 C)   Ht 5\' 1"  (1.549 m)   Wt 126 lb 3.2 oz (57.2 kg)   SpO2 100%   BMI 23.85 kg/m  , BMI Body mass  index is 23.85 kg/m. GENERAL:  Well appearing HEENT:  Pupils equal round and reactive, fundi not visualized, oral mucosa unremarkable NECK:  No jugular venous distention, waveform within normal limits, carotid upstroke brisk and symmetric, no bruits, no thyromegaly LYMPHATICS:  No cervical, inguinal adenopathy LUNGS:  Clear to auscultation bilaterally BACK:  No CVA tenderness CHEST:  Unremarkable HEART:  PMI not displaced or sustained,S1 and S2 within normal limits, no S3, no S4, no clicks, no rubs, soft apical systolic murmur heard best when seated but not increasing with the strain phase of Valsalva, no diastolic murmurs ABD:  Flat, positive bowel sounds normal in frequency in pitch, no bruits, no rebound, no guarding, no midline pulsatile mass, no hepatomegaly, no splenomegaly EXT:  2 plus pulses throughout, no edema, no cyanosis no clubbing SKIN:  No rashes no nodules NEURO:  Cranial nerves II through XII grossly intact, motor grossly intact throughout PSYCH:  Cognitively intact, oriented to person place and time    EKG:  EKG is ordered today. The ekg ordered today demonstrates sinus rhythm, rate 92, left bundle branch block with no old EKGs for comparison   Recent Labs: No results found for requested labs within last 8760 hours.    Lipid Panel    Component Value Date/Time   CHOL 153 07/29/2014 1210   TRIG 46 07/29/2014 1210   HDL 42 (L) 07/29/2014 1210   CHOLHDL 3.6 07/29/2014 1210   VLDL 9 07/29/2014 1210   LDLCALC 102 (H) 07/29/2014 1210      Wt Readings from Last 3 Encounters:  04/13/19 126 lb 3.2 oz (57.2 kg)  07/25/18 128 lb 12.8 oz (58.4 kg)  06/25/15 166 lb 4 oz (75.4 kg)      Other studies Reviewed: Additional studies/ records that were reviewed today include: EKG. Review of the above records demonstrates:  Please see elsewhere in the note.     ASSESSMENT AND PLAN:  TACHYCARDIA:   I might evaluate eventually with a monitor but at this point again  proceed as below to evaluate the abnormal EKG. I will have her wear a 2-week event monitor.  ABNORMAL EKG: The patient has an abnormal EKG with left bundle branch block.  I may start with an echocardiogram.  Current medicines are reviewed at length with the patient today.  The patient does not have concerns regarding medicines.  The following changes have been made:  no change  Labs/ tests ordered today include:   Orders Placed This Encounter  Procedures  . LONG TERM MONITOR-LIVE TELEMETRY (3-14 DAYS)  . EKG  12-Lead  . ECHOCARDIOGRAM COMPLETE     Disposition:   FU with me in one month.    Signed, Minus Breeding, MD  04/13/2019 10:11 AM    [facility]

## 2019-04-13 ENCOUNTER — Encounter: Payer: Self-pay | Admitting: Cardiology

## 2019-04-13 ENCOUNTER — Other Ambulatory Visit: Payer: Self-pay

## 2019-04-13 ENCOUNTER — Ambulatory Visit: Payer: 59 | Admitting: Cardiology

## 2019-04-13 VITALS — BP 124/80 | HR 97 | Temp 97.1°F | Ht 61.0 in | Wt 126.2 lb

## 2019-04-13 DIAGNOSIS — R9431 Abnormal electrocardiogram [ECG] [EKG]: Secondary | ICD-10-CM

## 2019-04-13 DIAGNOSIS — I447 Left bundle-branch block, unspecified: Secondary | ICD-10-CM | POA: Diagnosis not present

## 2019-04-13 NOTE — Patient Instructions (Signed)
Medication Instructions:  NO CHANGE *If you need a refill on your cardiac medications before your next appointment, please call your pharmacy*  Lab Work: If you have labs (blood work) drawn today and your tests are completely normal, you will receive your results only by: Marland Kitchen MyChart Message (if you have MyChart) OR . A paper copy in the mail If you have any lab test that is abnormal or we need to change your treatment, we will call you to review the results.  Testing/Procedures: Your physician has requested that you have an echocardiogram. Echocardiography is a painless test that uses sound waves to create images of your heart. It provides your doctor with information about the size and shape of your heart and how well your heart's chambers and valves are working. This procedure takes approximately one hour. There are no restrictions for this procedure.[place] has recommended that you wear a 2 WEEK event monitor. Event monitors are medical devices that record the heart's electrical activity. Doctors most often [place] these monitors to diagnose arrhythmias. Arrhythmias are problems with the speed or rhythm of the heartbeat. The monitor is a small, portable device. You can wear one while you do your normal daily activities. This is usually used to diagnose what is causing palpitations/syncope (passing out).WILL BE MAILED TO YOUR HOME   Follow-Up: At [facility], you and your health needs are our priority.  As part of our continuing mission to provide you with exceptional heart care, we have created designated Provider Care Teams.  These Care Teams include your primary Cardiologist (physician) and Advanced Practice Providers (APPs -  Physician Assistants and Nurse Practitioners) who all work together to provide you with the care you need, when you need it.  Your next appointment:   ONE MONTH  The format for your next appointment:   In Person  Provider:   Minus Breeding, MD

## 2019-04-23 ENCOUNTER — Telehealth: Payer: Self-pay

## 2019-04-23 NOTE — Telephone Encounter (Signed)
14 day ZIO ordered and mailed to pt.  

## 2019-04-25 ENCOUNTER — Other Ambulatory Visit: Payer: Self-pay

## 2019-04-25 ENCOUNTER — Ambulatory Visit (HOSPITAL_COMMUNITY): Payer: 59 | Attending: Cardiovascular Disease

## 2019-04-25 DIAGNOSIS — R9431 Abnormal electrocardiogram [ECG] [EKG]: Secondary | ICD-10-CM | POA: Insufficient documentation

## 2019-04-28 ENCOUNTER — Ambulatory Visit (INDEPENDENT_AMBULATORY_CARE_PROVIDER_SITE_OTHER): Payer: 59

## 2019-04-28 DIAGNOSIS — R9431 Abnormal electrocardiogram [ECG] [EKG]: Secondary | ICD-10-CM

## 2019-04-28 DIAGNOSIS — R000 Tachycardia, unspecified: Secondary | ICD-10-CM | POA: Diagnosis not present

## 2019-05-15 ENCOUNTER — Ambulatory Visit: Payer: 59 | Admitting: Cardiology

## 2019-05-17 DIAGNOSIS — R9431 Abnormal electrocardiogram [ECG] [EKG]: Secondary | ICD-10-CM | POA: Insufficient documentation

## 2019-05-17 DIAGNOSIS — Z7189 Other specified counseling: Secondary | ICD-10-CM | POA: Insufficient documentation

## 2019-05-17 DIAGNOSIS — R000 Tachycardia, unspecified: Secondary | ICD-10-CM | POA: Insufficient documentation

## 2019-05-17 NOTE — Progress Notes (Signed)
Cardiology Office Note   Date:  05/18/2019   ID:  Tracy Bradshaw, DOB 01/11/1976, MRN [id number]  PCP:  Vivi Barrack, MD  Cardiologist:   No primary care provider on file. Referring:  Vivi Barrack, MD  Chief Complaint  Patient presents with  . Tachycardia      History of Present Illness: Tracy Bradshaw is a 43 y.o. female who presents for tachycardia and chest pain.  I sent her for an echo and she had a low normal EF.  Since I last saw her she has had no new cardiovascular complaints.  She has been trying to do some walking and she is not having any significant limitations with this patient this summer fleeting pressing chest discomfort.  She otherwise is doing well.  Her heart rate she does not notice being rapid.  She is not having any shortness of breath, PND or orthopnea.   Past Medical History:  Diagnosis Date  . Abnormal Pap smear    HPV  . Acute blood loss anemia 04/30/2012  . Anemia of mother in pregnancy, delivered 05/02/2012  . Blood type O+   . Cesarean delivery delivered (arrest of descent - 11/30) 04/29/2012  . Fibroid   . PONV (postoperative nausea and vomiting)   . Postpartum care following cesarean delivery (1/27) 06/27/2015  . SAB (spontaneous abortion)   . Uterine fibroid 04/06/2011   Submucus     Past Surgical History:  Procedure Laterality Date  . CESAREAN SECTION  04/29/2012   Procedure: CESAREAN SECTION;  Surgeon: Elveria Royals, MD;  Location: [facility];  Service: Obstetrics;  Laterality: N/A;  . CESAREAN SECTION N/A 06/27/2015   Procedure: Repeat CESAREAN SECTION;  Surgeon: Aloha Gell, MD;  Location: [facility];  Service: Obstetrics;  Laterality: N/A;  EDD: 06/30/15     Current Outpatient Medications  Medication Sig Dispense Refill  . acetaminophen (TYLENOL) 325 MG tablet Take 2 tablets (650 mg total) by mouth every 4 (four) hours as needed (for pain scale < 4).    . albuterol (PROVENTIL HFA;VENTOLIN HFA) 108 (90 Base) MCG/ACT inhaler Inhale 1-2  puffs into the lungs as needed.     . Ascorbic Acid (VITAMIN C GUMMIES PO) Take by mouth. Chew 3 gummies daily    . ibuprofen (ADVIL) 600 MG tablet Take 600 mg by mouth as needed.    . Multiple Vitamins-Minerals (MULTIVITAMIN WITH MINERALS) tablet Take 1 tablet by mouth daily.    Marland Kitchen SALINE NASAL MIST NA Place into the nose as needed.     No current facility-administered medications for this visit.    Allergies:   Patient has no known allergies.    ROS:  Please see the history of present illness.   Otherwise, review of systems are positive for positive for none  All other systems are reviewed and negative.    PHYSICAL EXAM: VS:  BP 125/82   Pulse 86   Ht 5\' 1"  (1.549 m)   Wt 127 lb (57.6 kg)   SpO2 100%   BMI 24.00 kg/m  , BMI Body mass index is 24 kg/m. GENERAL:  Well appearing NECK:  No jugular venous distention, waveform within normal limits, carotid upstroke brisk and symmetric, no bruits, no thyromegaly LUNGS:  Clear to auscultation bilaterally CHEST:  Unremarkable HEART:  PMI not displaced or sustained,S1 and S2 within normal limits, no S3, no S4, no clicks, no rubs, no murmurs ABD:  Flat, positive bowel sounds normal in frequency in pitch, no bruits,  no rebound, no guarding, no midline pulsatile mass, no hepatomegaly, no splenomegaly EXT:  2 plus pulses throughout, no edema, no cyanosis no clubbing     EKG:  EKG is not ordered today.    Recent Labs: No results found for requested labs within last 8760 hours.    Lipid Panel    Component Value Date/Time   CHOL 153 07/29/2014 1210   TRIG 46 07/29/2014 1210   HDL 42 (L) 07/29/2014 1210   CHOLHDL 3.6 07/29/2014 1210   VLDL 9 07/29/2014 1210   LDLCALC 102 (H) 07/29/2014 1210      Wt Readings from Last 3 Encounters:  05/18/19 127 lb (57.6 kg)  04/13/19 126 lb 3.2 oz (57.2 kg)  07/25/18 128 lb 12.8 oz (58.4 kg)      Other studies Reviewed: Additional studies/ records that were reviewed today include:  Echo Review of the above records demonstrates:  See elsewhere     ASSESSMENT AND PLAN:  TACHYCARDIA:    The patient does have tachycardia but seems to be sinus tachycardia.  My plan is to increase her exercise and we talked specifically about this.  She is going to get a Fitbit to further follow her heart rate.  She has had normal thyroids.  She is not having any new symptoms.  I will follow-up with her heart rate monitoring in the future.   ABNORMAL EKG: She does have a low normal ejection fraction.  She has a conduction disturbance.  However, there is no other evident pathology.  I will see her again in 6 months and likely repeat an echocardiogram following this.   COVID EDUCATION: We talked about the vaccine.  She is going to consider this.  Current medicines are reviewed at length with the patient today.  The patient does not have concerns regarding medicines.  The following changes have been made:  None  Labs/ tests ordered today include:   No orders of the defined types were placed in this encounter.    Disposition:   FU with me in six months. Ronnell Guadalajara, MD  05/18/2019 5:32 PM    [place]

## 2019-05-18 ENCOUNTER — Ambulatory Visit: Payer: 59 | Admitting: Cardiology

## 2019-05-18 ENCOUNTER — Encounter (INDEPENDENT_AMBULATORY_CARE_PROVIDER_SITE_OTHER): Payer: Self-pay

## 2019-05-18 ENCOUNTER — Encounter: Payer: Self-pay | Admitting: Cardiology

## 2019-05-18 ENCOUNTER — Other Ambulatory Visit: Payer: Self-pay

## 2019-05-18 VITALS — BP 125/82 | HR 86 | Ht 61.0 in | Wt 127.0 lb

## 2019-05-18 DIAGNOSIS — R9431 Abnormal electrocardiogram [ECG] [EKG]: Secondary | ICD-10-CM | POA: Diagnosis not present

## 2019-05-18 DIAGNOSIS — R000 Tachycardia, unspecified: Secondary | ICD-10-CM

## 2019-05-18 DIAGNOSIS — Z7189 Other specified counseling: Secondary | ICD-10-CM | POA: Diagnosis not present

## 2019-05-18 NOTE — Patient Instructions (Addendum)
Medication Instructions:  Your physician recommends that you continue on your current medications as directed. Please refer to the Current Medication list given to you today.  *If you need a refill on your cardiac medications before your next appointment, please call your pharmacy*  Lab Work: none If you have labs (blood work) drawn today and your tests are completely normal, you will receive your results only by: Marland Kitchen MyChart Message (if you have MyChart) OR . A paper copy in the mail If you have any lab test that is abnormal or we need to change your treatment, we will call you to review the results.  Testing/Procedures: none  Follow-Up: At [facility], you and your health needs are our priority.  As part of our continuing mission to provide you with exceptional heart care, we have created designated Provider Care Teams.  These Care Teams include your primary Cardiologist (physician) and Advanced Practice Providers (APPs -  Physician Assistants and Nurse Practitioners) who all work together to provide you with the care you need, when you need it.  Your next appointment:   6 month(s)  The format for your next appointment:   Either In Person or Virtual  Provider:   Minus Breeding, MD

## 2019-05-21 ENCOUNTER — Other Ambulatory Visit: Payer: Self-pay | Admitting: Cardiology

## 2019-05-21 DIAGNOSIS — R000 Tachycardia, unspecified: Secondary | ICD-10-CM

## 2019-05-21 DIAGNOSIS — R9431 Abnormal electrocardiogram [ECG] [EKG]: Secondary | ICD-10-CM

## 2019-07-17 ENCOUNTER — Ambulatory Visit: Payer: 59 | Attending: Internal Medicine

## 2019-07-17 DIAGNOSIS — Z20822 Contact with and (suspected) exposure to covid-19: Secondary | ICD-10-CM

## 2019-07-18 LAB — NOVEL CORONAVIRUS, NAA: SARS-CoV-2, NAA: NOT DETECTED

## 2019-09-19 ENCOUNTER — Ambulatory Visit: Payer: 59 | Attending: Internal Medicine

## 2019-09-19 DIAGNOSIS — Z20822 Contact with and (suspected) exposure to covid-19: Secondary | ICD-10-CM

## 2019-09-21 LAB — NOVEL CORONAVIRUS, NAA: SARS-CoV-2, NAA: NOT DETECTED

## 2019-09-21 LAB — SARS-COV-2, NAA 2 DAY TAT

## 2019-12-18 ENCOUNTER — Telehealth: Payer: Self-pay

## 2019-12-18 NOTE — Telephone Encounter (Signed)
Patient would like to transfer from Dr. Jerline Pain to [facility]. She would feel more comfortable under the care of a female

## 2019-12-19 NOTE — Telephone Encounter (Signed)
Ok with me 

## 2019-12-19 NOTE — Telephone Encounter (Signed)
LVM with patient to schedule with Aldona Bar

## 2019-12-19 NOTE — Telephone Encounter (Signed)
[place] with me.   Algis Greenhouse. Jerline Pain, MD 12/19/2019 7:56 AM

## 2019-12-25 ENCOUNTER — Encounter: Payer: Self-pay | Admitting: Physician Assistant

## 2019-12-25 ENCOUNTER — Ambulatory Visit (INDEPENDENT_AMBULATORY_CARE_PROVIDER_SITE_OTHER)
Admission: RE | Admit: 2019-12-25 | Discharge: 2019-12-25 | Disposition: A | Discharge status: Home / Self Care | Payer: 59 | Source: Ambulatory Visit | Attending: Physician Assistant | Admitting: Physician Assistant

## 2019-12-25 ENCOUNTER — Ambulatory Visit: Payer: 59 | Admitting: Physician Assistant

## 2019-12-25 ENCOUNTER — Other Ambulatory Visit: Payer: Self-pay

## 2019-12-25 VITALS — BP 110/80 | HR 81 | Temp 98.2°F | Ht 61.0 in | Wt 131.4 lb

## 2019-12-25 DIAGNOSIS — R062 Wheezing: Secondary | ICD-10-CM

## 2019-12-25 DIAGNOSIS — M25551 Pain in right hip: Secondary | ICD-10-CM | POA: Diagnosis not present

## 2019-12-25 DIAGNOSIS — R42 Dizziness and giddiness: Secondary | ICD-10-CM | POA: Diagnosis not present

## 2019-12-25 MED ORDER — BUDESONIDE-FORMOTEROL FUMARATE 80-4.5 MCG/ACT IN AERO: 2.0000 | INHALATION_SPRAY | Freq: Two times a day (BID) | RESPIRATORY_TRACT | 3 refills | Status: DC

## 2019-12-25 MED ORDER — METHYLPREDNISOLONE ACETATE 80 MG/ML IJ SUSP
80.0000 mg | Freq: Once | INTRAMUSCULAR | Status: AC
  Administered 2019-12-25: 80 mg via INTRAMUSCULAR

## 2019-12-25 NOTE — Patient Instructions (Signed)
It was great to see you!  For your cough: Please get xray -- you can walk in at [facility] without a scheduled appt. The address is [address] xray is located in the basement. Hours of operation are M-F 8:30am to 5:00pm. Closed for lunch between 12:30 and 1:00pm. Start Symbicort inhaler in the AM and PM. Follow-up in 1 month, sooner if concerns.  For your hip: Please schedule an appointment with Ander Purpura (our office physical therapist) on your way out.  For your vertigo: You will get a call about doing physical therapy with another therapist at a different office to help correct your vertigo.  Let's follow-up in 1 month, sooner if you have concerns.  Take care,  Inda Coke PA-C

## 2019-12-25 NOTE — Progress Notes (Signed)
Tracy Bradshaw is a 44 y.o. female is here to transfer care.  I acted as a Education administrator for [employer], PA-C Serita Sheller, [place]  History of Present Illness:   Chief Complaint  Patient presents with   Transfer of care   chest congestion   Ear Problem    HPI  Transfer of Care Pt is here to transfer care from Dr. Jerline Pain.  Chest congestion February 2020 had significant cough. Never got tested for COVID-19. She had significant symptoms for at least 1 month. Since that time her chest has felt congestion. About 3 weeks ago she got a cold. She tested negative for COVID on 12/15/19. Coughing and expectorating clear sputum. Pt has tried Theraflu and Aleve. Denies fever or chills. If she takes Mucinex she gets jittery. Takes Allegra about once a week. No unintentional wieght loss, prior hx asthma. Non smoker.  Wt Readings from Last 4 Encounters:  12/25/19 131 lb 6.1 oz (59.6 kg)  05/18/19 127 lb (57.6 kg)  04/13/19 126 lb 3.2 oz (57.2 kg)  07/25/18 128 lb 12.8 oz (58.4 kg)   Fully vaccinated for COVID-17 August 2019. Didn't have significant side effects with the doses.  Ear problem Pt c/o ear problem x 1 week, sounds seem very loud. When she turns in the bed she gets dizziness symptoms -- feels like the room is spinning. Denies prior episodes of vertigo. Symptoms started up after recent cold. Denies: changes in vision, headache, changes in gait.  R hip pain Has tightness in her R hip. Has trouble quickly going from sitting to standing due to pain. Denies known trauma, sensation of bulge. She is pretty active. Does walking regularly.    Health Maintenance Due  Topic Date Due   Hepatitis C Screening  Never done   PAP SMEAR-Modifier  02/26/2012    Past Medical History:  Diagnosis Date   Abnormal Pap smear    HPV   Acute blood loss anemia 04/30/2012   Blood type O+    PONV (postoperative nausea and vomiting)    SAB (spontaneous abortion)    Uterine fibroid 04/06/2011    Submucus      Social History   Tobacco Use   Smoking status: Never Smoker   Smokeless tobacco: Never Used  Vaping Use   Vaping Use: Never assessed  Substance Use Topics   Alcohol use: Yes    Comment: very rare   Drug use: No    Past Surgical History:  Procedure Laterality Date   CESAREAN SECTION  04/29/2012   Procedure: CESAREAN SECTION;  Surgeon: Elveria Royals, MD;  Location: [facility];  Service: Obstetrics;  Laterality: N/A;   CESAREAN SECTION N/A 06/27/2015   Procedure: Repeat CESAREAN SECTION;  Surgeon: Aloha Gell, MD;  Location: [facility];  Service: Obstetrics;  Laterality: N/A;  EDD: 06/30/15    Family History  Problem Relation Age of Onset   Hypertension Mother    Heart disease Father 64       Died age 66   Heart disease Brother 45       "Big Heart"    Other Neg Hx    Cancer Neg Hx     PMHx, SurgHx, SocialHx, FamHx, Medications, and Allergies were reviewed in the Visit Navigator and updated as appropriate.   Patient Active Problem List   Diagnosis Date Noted   Tachycardia 05/17/2019   Nonspecific abnormal electrocardiogram (ECG) (EKG) 05/17/2019    Social History   Tobacco Use   Smoking status: Never  Smoker   Smokeless tobacco: Never Used  Vaping Use   Vaping Use: Never assessed  Substance Use Topics   Alcohol use: Yes    Comment: very rare   Drug use: No    Current Medications and Allergies:    Current Outpatient Medications:    acetaminophen (TYLENOL) 325 MG tablet, Take 2 tablets (650 mg total) by mouth every 4 (four) hours as needed (for pain scale < 4)., Disp: , Rfl:    albuterol (PROVENTIL HFA;VENTOLIN HFA) 108 (90 Base) MCG/ACT inhaler, Inhale 1-2 puffs into the lungs as needed. , Disp: , Rfl:    Ascorbic Acid (VITAMIN C GUMMIES PO), Take by mouth. Chew 3 gummies daily, Disp: , Rfl:    ibuprofen (ADVIL) 600 MG tablet, Take 600 mg by mouth as needed., Disp: , Rfl:    Multiple Vitamins-Minerals (MULTIVITAMIN WITH  MINERALS) tablet, Take 1 tablet by mouth daily., Disp: , Rfl:    SALINE NASAL MIST NA, Place into the nose as needed., Disp: , Rfl:    budesonide-formoterol (SYMBICORT) 80-4.5 MCG/ACT inhaler, Inhale 2 puffs into the lungs 2 (two) times daily., Disp: 1 Inhaler, Rfl: 3  No Known Allergies  Review of Systems   ROS  Negative unless otherwise specified per HPI.  Vitals:   Vitals:   12/25/19 0913  BP: 110/80  Pulse: 81  Temp: 98.2 F (36.8 C)  TempSrc: Temporal  SpO2: 98%  Weight: 131 lb 6.1 oz (59.6 kg)  Height: 5\' 1"  (1.549 m)     Body mass index is 24.82 kg/m.   Physical Exam:    Physical Exam Vitals and nursing note reviewed.  Constitutional:      General: She is not in acute distress.    Appearance: She is well-developed. She is not ill-appearing or toxic-appearing.  HENT:     Head: Normocephalic and atraumatic.     Right Ear: Tympanic membrane, ear canal and external ear normal. Tympanic membrane is not erythematous, retracted or bulging.     Left Ear: Tympanic membrane, ear canal and external ear normal. Tympanic membrane is not erythematous, retracted or bulging.     Nose: Nose normal.     Right Sinus: No maxillary sinus tenderness or frontal sinus tenderness.     Left Sinus: No maxillary sinus tenderness or frontal sinus tenderness.     Mouth/Throat:     Pharynx: Uvula midline. No posterior oropharyngeal erythema.  Eyes:     General: Lids are normal.     Conjunctiva/sclera: Conjunctivae normal.  Neck:     Trachea: Trachea normal.  Cardiovascular:     Rate and Rhythm: Normal rate and regular rhythm.     Pulses: Normal pulses.     Heart sounds: Normal heart sounds, S1 normal and S2 normal.     Comments: No LE edema Pulmonary:     Effort: Pulmonary effort is normal.     Breath sounds: Examination of the right-upper field reveals wheezing. Examination of the left-upper field reveals wheezing. Examination of the right-middle field reveals wheezing. Examination  of the left-middle field reveals wheezing. Examination of the right-lower field reveals wheezing. Examination of the left-lower field reveals wheezing. Wheezing (mild end-inspiratory wheeze) present. No decreased breath sounds, rhonchi or rales.  Musculoskeletal:     Comments: R hip with essentially normal ROM; no point tenderness  Lymphadenopathy:     Cervical: No cervical adenopathy.  Skin:    General: Skin is warm and dry.  Neurological:     General: No focal deficit  present.     Mental Status: She is alert.     GCS: GCS eye subscore is 4. GCS verbal subscore is 5. GCS motor subscore is 6.     Cranial Nerves: Cranial nerves are intact.     Sensory: Sensation is intact.     Motor: Motor function is intact.  Psychiatric:        Speech: Speech normal.        Behavior: Behavior normal. Behavior is cooperative.      Assessment and Plan:    Jenascia was seen today for transfer of care, chest congestion and ear problem.  Diagnoses and all orders for this visit:  Wheezing Wheeze on my exam today. Depo-medrol injection provided. Will order chest xray given chronicity of symptoms. Start daily symbicort and follow-up in 1 month, sooner if concerns. -     DG Chest 2 View; Future -     methylPREDNISolone acetate (DEPO-MEDROL) injection 80 mg  Vertigo Suspicious for BPPV, offered vestibular rehab, and she was agreeable. Neuro exam WNL. -     Ambulatory referral to Physical Therapy  Right hip pain Possible hip flexor muscle strain; refer to PT for further evaluation and management. -     Ambulatory referral to Physical Therapy  Other orders -     budesonide-formoterol (SYMBICORT) 80-4.5 MCG/ACT inhaler; Inhale 2 puffs into the lungs 2 (two) times daily.    Reviewed expectations re: course of current medical issues.  Discussed self-management of symptoms.  Outlined signs and symptoms indicating need for more acute intervention.  Patient verbalized understanding and all questions  were answered.  See orders for this visit as documented in the electronic medical record.  Patient received an After Visit Summary.  CMA or LPN served as scribe during this visit. History, Physical, and Plan performed by medical provider. The above documentation has been reviewed and is accurate and complete.   Inda Coke, PA-C [place] [place] 12/25/2019  Follow-up: No follow-ups on file.

## 2020-01-01 ENCOUNTER — Encounter: Payer: Self-pay | Admitting: Physical Therapy

## 2020-01-01 ENCOUNTER — Other Ambulatory Visit: Payer: Self-pay

## 2020-01-01 ENCOUNTER — Ambulatory Visit: Payer: 59 | Admitting: Physical Therapy

## 2020-01-01 DIAGNOSIS — R42 Dizziness and giddiness: Secondary | ICD-10-CM | POA: Diagnosis not present

## 2020-01-01 NOTE — Patient Instructions (Signed)
Access Code: [email or web address] URL: [email or web address] Date: 01/01/2020 Prepared by: Faustino Congress  Exercises Brandt-Daroff Vestibular Exercise - 2 x daily - 7 x weekly - 3-5 reps - 1 sets Supine to Left Side Lying Vestibular Habituation - 1 x daily - 7 x weekly - 5 reps - 1 sets

## 2020-01-01 NOTE — Therapy (Signed)
[facility] [address] [place], [place], [address] Phone: [phone]   Fax:  [phone]  Physical Therapy Evaluation  Patient Details  Name: Tracy Bradshaw MRN: [id number] Date of Birth: [birth year] Referring Provider (PT): Inda Coke, [place]   Encounter Date: 01/01/2020   PT End of Session - 01/01/20 1404    Visit Number 1    PT Start Time 5176    PT Stop Time 1350    PT Time Calculation (min) 45 min    Activity Tolerance Patient tolerated treatment well    Behavior During Therapy [facility] for tasks assessed/performed           Past Medical History:  Diagnosis Date   Abnormal Pap smear    HPV   Acute blood loss anemia 04/30/2012   Blood type O+    PONV (postoperative nausea and vomiting)    SAB (spontaneous abortion)    Uterine fibroid 04/06/2011   Submucus     Past Surgical History:  Procedure Laterality Date   CESAREAN SECTION  04/29/2012   Procedure: CESAREAN SECTION;  Surgeon: Elveria Royals, MD;  Location: [facility];  Service: Obstetrics;  Laterality: N/A;   CESAREAN SECTION N/A 06/27/2015   Procedure: Repeat CESAREAN SECTION;  Surgeon: Aloha Gell, MD;  Location: [facility];  Service: Obstetrics;  Laterality: N/A;  EDD: 06/30/15    There were no vitals filed for this visit.    Subjective Assessment - 01/01/20 1307    Subjective Pt is a 44 y/o female who presents to OPPT with c/o vertigo x 3 weeks ago.  She reports symptoms with quick head movements, and sometimes with rolling in bed.  She reports increased fullness in ears following a cold, with sound sensitivity as well.    Patient Stated Goals improve dizziness    Currently in Pain? No/denies              [facility] PT Assessment - 01/01/20 1309      Assessment   Medical Diagnosis vertigo    Referring Provider (PT) Inda Coke, PA-C    Onset Date/Surgical Date 12/10/19    Hand Dominance Right    Next MD Visit 01/28/20    Prior Therapy none      Restrictions     Weight Bearing Restrictions No      Balance Screen   Has the patient fallen in the past 6 months No    Has the patient had a decrease in activity level because of a fear of falling?  No    Is the patient reluctant to leave their home because of a fear of falling?  No      Home Environment   Living Environment Private residence    Living Arrangements Children;Spouse/significant other   7 and 4 y/o children   Type of Home House    Additional Comments no difficulty with stairs      Prior Function   Level of Independence Independent    Vocation Works at home    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    Leisure walks, shopping, spend time with friends      Cognition   Overall Cognitive Status Within Functional Limits for tasks assessed                  Vestibular Assessment - 01/01/20 1315      Vestibular Assessment   General Observation no symptoms at rest      Symptom Behavior   Subjective history of current  problem see subjective    Type of Dizziness  Spinning;Imbalance    Frequency of Dizziness daily    Duration of Dizziness seconds    Symptom Nature Positional    Aggravating Factors Mornings;Rolling to right;Lying supine    Progression of Symptoms Better      Oculomotor Exam   Oculomotor Alignment Normal    Spontaneous Absent    Gaze-induced  Absent    Smooth Pursuits Intact    Saccades Intact      Oculomotor Exam-Fixation Suppressed    Left Head Impulse (-)    Right Head Impulse (+)      Positional Testing   Dix-Hallpike Dix-Hallpike Right;Dix-Hallpike Left    Sidelying Test Sidelying Right;Sidelying Left    Horizontal Canal Testing Horizontal Canal Right;Horizontal Canal Left      Dix-Hallpike Right   Dix-Hallpike Right Duration none    Dix-Hallpike Right Symptoms No nystagmus      Dix-Hallpike Left   Dix-Hallpike Left Duration none    Dix-Hallpike Left Symptoms No nystagmus      Sidelying Right   Sidelying Right Duration none     Sidelying Right Symptoms No nystagmus      Sidelying Left   Sidelying Left Duration 2-3 sec    Sidelying Left Symptoms No nystagmus      Horizontal Canal Right   Horizontal Canal Right Duration none    Horizontal Canal Right Symptoms Normal      Horizontal Canal Left   Horizontal Canal Left Duration 4-5 sec; possible nystagmus seen?    Horizontal Canal Left Symptoms Nystagmus   upbeating rotary - 1-2 beats noted             Objective measurements completed on examination: See above findings.        Vestibular Treatment/Exercise - 01/01/20 1400      Vestibular Treatment/Exercise   Vestibular Treatment Provided Canalith Repositioning;Habituation    Canalith Repositioning Epley Manuever Left;Canal Roll Left    Habituation Exercises Brandt Daroff;Horizontal Roll       EPLEY MANUEVER LEFT   Number of Reps  1    Overall Response  Improved Symptoms      Canal Roll Left   Number of Reps  1    Overall Response  Improved Symptoms      Nestor Lewandowsky   Number of Reps  1    Symptom Description  instructed for HEP      Horizontal Roll   Number of Reps  1    Symptom Description  instructed for HEP                 PT Education - 01/01/20 1402    Education Details HEP, BPPV    Person(s) Educated Patient    Methods Explanation;Handout    Comprehension Verbalized understanding               PT Long Term Goals - 01/01/20 1404      PT LONG TERM GOAL #1   Title report 75% improvement in vertigo    Status New                  Plan - 01/01/20 1405    Clinical Impression Statement Pt is a 44 y/o female who presents to OPPT for 3 week hx of vertigo.  Subjective highly suspicious of BPPV but also with hx of recent viral infection so cannot rule out vestibular neuritis.  Pt has some symptoms with positional testing, and possible upbeating rotary  nystagmus noted  Treated for BPPV today and issued HEP to address.  Pt scheduled for Lt hip pain eval next week  with different provider and will leave frequency and duration up to next PT.  Will see for PRN for vertigo at this time.    Examination-Activity Limitations Bed Mobility;Locomotion Level    Examination-Participation Restrictions Cleaning;Meal Prep    Stability/Clinical Decision Making Stable/Uncomplicated    Clinical Decision Making Low    Rehab Potential Good    PT Frequency --   PRN - will defer to next visit   PT Duration --   will defer to next visit   PT Treatment/Interventions ADLs/Self Care Home Management;Canalith Repostioning;Cryotherapy;Electrical Stimulation;Moist Heat;Therapeutic exercise;Therapeutic activities;Balance training;Functional mobility training;Neuromuscular re-education;Patient/family education;Manual techniques;Vestibular    PT Next Visit Plan treat vertigo PRN, Lt sided BPPV    PT Home Exercise Plan Access Code: [email or web address]    Consulted and Agree with Plan of Care Patient           Patient will benefit from skilled therapeutic intervention in order to improve the following deficits and impairments:  Dizziness  Visit Diagnosis: Dizziness and giddiness - Plan: PT plan of care cert/re-cert     Problem List Patient Active Problem List   Diagnosis Date Noted   Tachycardia 05/17/2019   Nonspecific abnormal electrocardiogram (ECG) (EKG) 05/17/2019      Laureen Abrahams, PT, DPT 01/01/20 2:21 PM    [facility] [address] [place], [place], [address] Phone: [phone]   Fax:  [phone]  Name: Denzil Bristol MRN: [id number] Date of Birth: 1975-12-15

## 2020-01-08 ENCOUNTER — Ambulatory Visit: Payer: 59 | Admitting: Physical Therapy

## 2020-01-28 ENCOUNTER — Ambulatory Visit: Payer: 59 | Admitting: Physician Assistant

## 2020-02-06 ENCOUNTER — Ambulatory Visit: Payer: 59 | Admitting: Physician Assistant

## 2020-02-19 ENCOUNTER — Other Ambulatory Visit: Payer: 59

## 2020-02-22 ENCOUNTER — Ambulatory Visit: Payer: 59 | Admitting: Physician Assistant

## 2020-02-22 DIAGNOSIS — Z0289 Encounter for other administrative examinations: Secondary | ICD-10-CM

## 2020-02-22 NOTE — Progress Notes (Deleted)
Lavaughn Bisig is a 44 y.o. female is here for follow up.  I acted as a Education administrator for [employer], PA-C [employer], LPN   History of Present Illness:   No chief complaint on file.   HPI  Wheezing Pt follow up from one month ago.   Health Maintenance Due  Topic Date Due  . Hepatitis C Screening  Never done  . PAP SMEAR-Modifier  02/26/2012  . INFLUENZA VACCINE  12/30/2019  . MAMMOGRAM  01/22/2020    Past Medical History:  Diagnosis Date  . Abnormal Pap smear    HPV  . Acute blood loss anemia 04/30/2012  . Blood type O+   . PONV (postoperative nausea and vomiting)   . SAB (spontaneous abortion)   . Uterine fibroid 04/06/2011   Submucus      Social History   Tobacco Use  . Smoking status: Never Smoker  . Smokeless tobacco: Never Used  Vaping Use  . Vaping Use: Never assessed  Substance Use Topics  . Alcohol use: Yes    Comment: very rare  . Drug use: No    Past Surgical History:  Procedure Laterality Date  . CESAREAN SECTION  04/29/2012   Procedure: CESAREAN SECTION;  Surgeon: Elveria Royals, MD;  Location: [facility];  Service: Obstetrics;  Laterality: N/A;  . CESAREAN SECTION N/A 06/27/2015   Procedure: Repeat CESAREAN SECTION;  Surgeon: Aloha Gell, MD;  Location: [facility];  Service: Obstetrics;  Laterality: N/A;  EDD: 06/30/15    Family History  Problem Relation Age of Onset  . Hypertension Mother   . Heart disease Father 29       Died age 44  . Heart disease Brother 32       "Big Heart"   . Other Neg Hx   . Cancer Neg Hx     PMHx, SurgHx, SocialHx, FamHx, Medications, and Allergies were reviewed in the Visit Navigator and updated as appropriate.   Patient Active Problem List   Diagnosis Date Noted  . Tachycardia 05/17/2019  . Nonspecific abnormal electrocardiogram (ECG) (EKG) 05/17/2019    Social History   Tobacco Use  . Smoking status: Never Smoker  . Smokeless tobacco: Never Used  Vaping Use  . Vaping Use: Never assessed  Substance  Use Topics  . Alcohol use: Yes    Comment: very rare  . Drug use: No    Current Medications and Allergies:    Current Outpatient Medications:  .  acetaminophen (TYLENOL) 325 MG tablet, Take 2 tablets (650 mg total) by mouth every 4 (four) hours as needed (for pain scale < 4)., Disp: , Rfl:  .  albuterol (PROVENTIL HFA;VENTOLIN HFA) 108 (90 Base) MCG/ACT inhaler, Inhale 1-2 puffs into the lungs as needed. , Disp: , Rfl:  .  Ascorbic Acid (VITAMIN C GUMMIES PO), Take by mouth. Chew 3 gummies daily, Disp: , Rfl:  .  budesonide-formoterol (SYMBICORT) 80-4.5 MCG/ACT inhaler, Inhale 2 puffs into the lungs 2 (two) times daily., Disp: 1 Inhaler, Rfl: 3 .  ibuprofen (ADVIL) 600 MG tablet, Take 600 mg by mouth as needed., Disp: , Rfl:  .  Multiple Vitamins-Minerals (MULTIVITAMIN WITH MINERALS) tablet, Take 1 tablet by mouth daily., Disp: , Rfl:  .  SALINE NASAL MIST NA, Place into the nose as needed., Disp: , Rfl:   No Known Allergies  Review of Systems   ROS  Vitals:  There were no vitals filed for this visit.   There is no height or weight  on file to calculate BMI.   Physical Exam:    Physical Exam   Assessment and Plan:    There are no diagnoses linked to this encounter.  . Reviewed expectations re: course of current medical issues. . Discussed self-management of symptoms. . Outlined signs and symptoms indicating need for more acute intervention. . Patient verbalized understanding and all questions were answered. . See orders for this visit as documented in the electronic medical record. . Patient received an After Visit Summary.  ***  Inda Coke, PA-C [place] [place] 02/22/2020  Follow-up: No follow-ups on file.

## 2020-03-01 ENCOUNTER — Other Ambulatory Visit: Payer: 59

## 2020-03-01 DIAGNOSIS — Z20822 Contact with and (suspected) exposure to covid-19: Secondary | ICD-10-CM

## 2020-03-02 LAB — NOVEL CORONAVIRUS, NAA: SARS-CoV-2, NAA: NOT DETECTED

## 2020-03-02 LAB — SARS-COV-2, NAA 2 DAY TAT

## 2020-06-19 ENCOUNTER — Encounter: Payer: Self-pay | Admitting: Physician Assistant

## 2020-06-19 ENCOUNTER — Other Ambulatory Visit: Payer: Self-pay

## 2020-06-19 ENCOUNTER — Telehealth (INDEPENDENT_AMBULATORY_CARE_PROVIDER_SITE_OTHER): Payer: 59 | Admitting: Physician Assistant

## 2020-06-19 VITALS — Temp 98.6°F | Ht 61.0 in | Wt 133.0 lb

## 2020-06-19 DIAGNOSIS — U071 COVID-19: Secondary | ICD-10-CM | POA: Diagnosis not present

## 2020-06-19 MED ORDER — PREDNISONE 20 MG PO TABS: 40.0000 mg | ORAL_TABLET | Freq: Every day | ORAL | 0 refills | Status: DC

## 2020-06-19 NOTE — Progress Notes (Signed)
Virtual Visit via Video   I connected with Tracy Bradshaw on 06/19/20 at 12:00 PM EST by a video enabled telemedicine application and verified that I am speaking with the correct person using two identifiers. Location patient: Home Location provider: [facility], Office Persons participating in the virtual visit: Tracy Bradshaw, Inda Coke PA-C,Donna Venango, LPN   I discussed the limitations of evaluation and management by telemedicine and the availability of in person appointments. The patient expressed understanding and agreed to proceed.  I acted as a Education administrator for [employer], PA-C [employer], LPN   Subjective:   HPI:   COVID-19 Symptom onset: Tuesday 06/17/2020  Travel/contacts: No  Vaccination status: 2 shots  Testing results: Home test positive yesterday  Patient endorses the following symptoms: Body aches, chills, runny nose, productive cough, headache off and on, middle of pain  Patient denies the following symptoms: SOB, chest pain  Possible history of asthma? She was seen by me in July and started on albuterol and symbicort. She never picked up the symbicort. She was told to follow-up in 1 month but she didn't. She told me that she felt jittery.   Treatments tried: Thera Flu, Aleve  Patient risk factors: Current [id number]-48 risk of complications score: 0 Smoking status: Eliska Hamil  reports that she has never smoked. She has never used smokeless tobacco. If female, currently pregnant? []   Yes [x]   No  ROS: See pertinent positives and negatives per HPI.  Patient Active Problem List   Diagnosis Date Noted  . Tachycardia 05/17/2019  . Nonspecific abnormal electrocardiogram (ECG) (EKG) 05/17/2019    Social History   Tobacco Use  . Smoking status: Never Smoker  . Smokeless tobacco: Never Used  Substance Use Topics  . Alcohol use: Yes    Comment: very rare    Current Outpatient Medications:  .  acetaminophen (TYLENOL) 325 MG tablet, Take 2 tablets  (650 mg total) by mouth every 4 (four) hours as needed (for pain scale < 4)., Disp: , Rfl:  .  Ascorbic Acid (VITAMIN C GUMMIES PO), Take by mouth. Chew 3 gummies daily, Disp: , Rfl:  .  ibuprofen (ADVIL) 600 MG tablet, Take 600 mg by mouth as needed., Disp: , Rfl:  .  Multiple Vitamins-Minerals (MULTIVITAMIN WITH MINERALS) tablet, Take 1 tablet by mouth daily., Disp: , Rfl:  .  predniSONE (DELTASONE) 20 MG tablet, Take 2 tablets (40 mg total) by mouth daily., Disp: 10 tablet, Rfl: 0 .  SALINE NASAL MIST NA, Place into the nose as needed., Disp: , Rfl:  .  albuterol (PROVENTIL HFA;VENTOLIN HFA) 108 (90 Base) MCG/ACT inhaler, Inhale 1-2 puffs into the lungs as needed.  (Patient not taking: Reported on 06/19/2020), Disp: , Rfl:  .  budesonide-formoterol (SYMBICORT) 80-4.5 MCG/ACT inhaler, Inhale 2 puffs into the lungs 2 (two) times daily. (Patient not taking: Reported on 06/19/2020), Disp: 1 Inhaler, Rfl: 3  No Known Allergies  Objective:   VITALS: Per patient if applicable, see vitals. GENERAL: Alert, appears well and in no acute distress. HEENT: Atraumatic, conjunctiva clear, no obvious abnormalities on inspection of external nose and ears. NECK: Normal movements of the head and neck. CARDIOPULMONARY: No increased WOB. Speaking in clear sentences. I:E ratio WNL.  MS: Moves all visible extremities without noticeable abnormality. PSYCH: Pleasant and cooperative, well-groomed. Speech normal rate and rhythm. Affect is appropriate. Insight and judgement are appropriate. Attention is focused, linear, and appropriate.  NEURO: CN grossly intact. Oriented as arrived to appointment on time with  no prompting. Moves both UE equally.  SKIN: No obvious lesions, wounds, erythema, or cyanosis noted on face or hands.  Assessment and Plan:   Nelida was seen today for covid positive.  Diagnoses and all orders for this visit:  COVID-19  Other orders -     predniSONE (DELTASONE) 20 MG tablet; Take 2 tablets  (40 mg total) by mouth daily.    No red flags on discussion, patient is not in any obvious distress during our visit.  Discussed progression of most viral illnesses, and recommended supportive care at this point in time.   Start oral prednisone 20 mg BID x 5 days for possible underlying asthma. Trial albuterol prn if tolerated.  Discussed over the counter supportive care options, including Tylenol 500 mg q 8 hours, with recommendations to push fluids and rest. Reviewed return precautions including new/worsening fever, SOB, new/worsening cough, sudden onset changes of symptoms. Recommended need to self-quarantine and practice social distancing until symptoms resolve. I recommend that patient follow-up if symptoms worsen or persist despite treatment x 7-10 days, sooner if needed.  I discussed the assessment and treatment plan with the patient. The patient was provided an opportunity to ask questions and all were answered. The patient agreed with the plan and demonstrated an understanding of the instructions.   The patient was advised to call back or seek an in-person evaluation if the symptoms worsen or if the condition fails to improve as anticipated.   CMA or LPN served as scribe during this visit. History, Physical, and Plan performed by medical provider. The above documentation has been reviewed and is accurate and complete.  [place], [place] 06/19/2020

## 2020-11-12 IMAGING — DX DG CHEST 2V
2 series · 2 of 2 positions shown · non-contrast
Comparison: No prior.

CLINICAL DATA: Wheezing and cough.

EXAM:
CHEST - 2 VIEW

[chest pa]
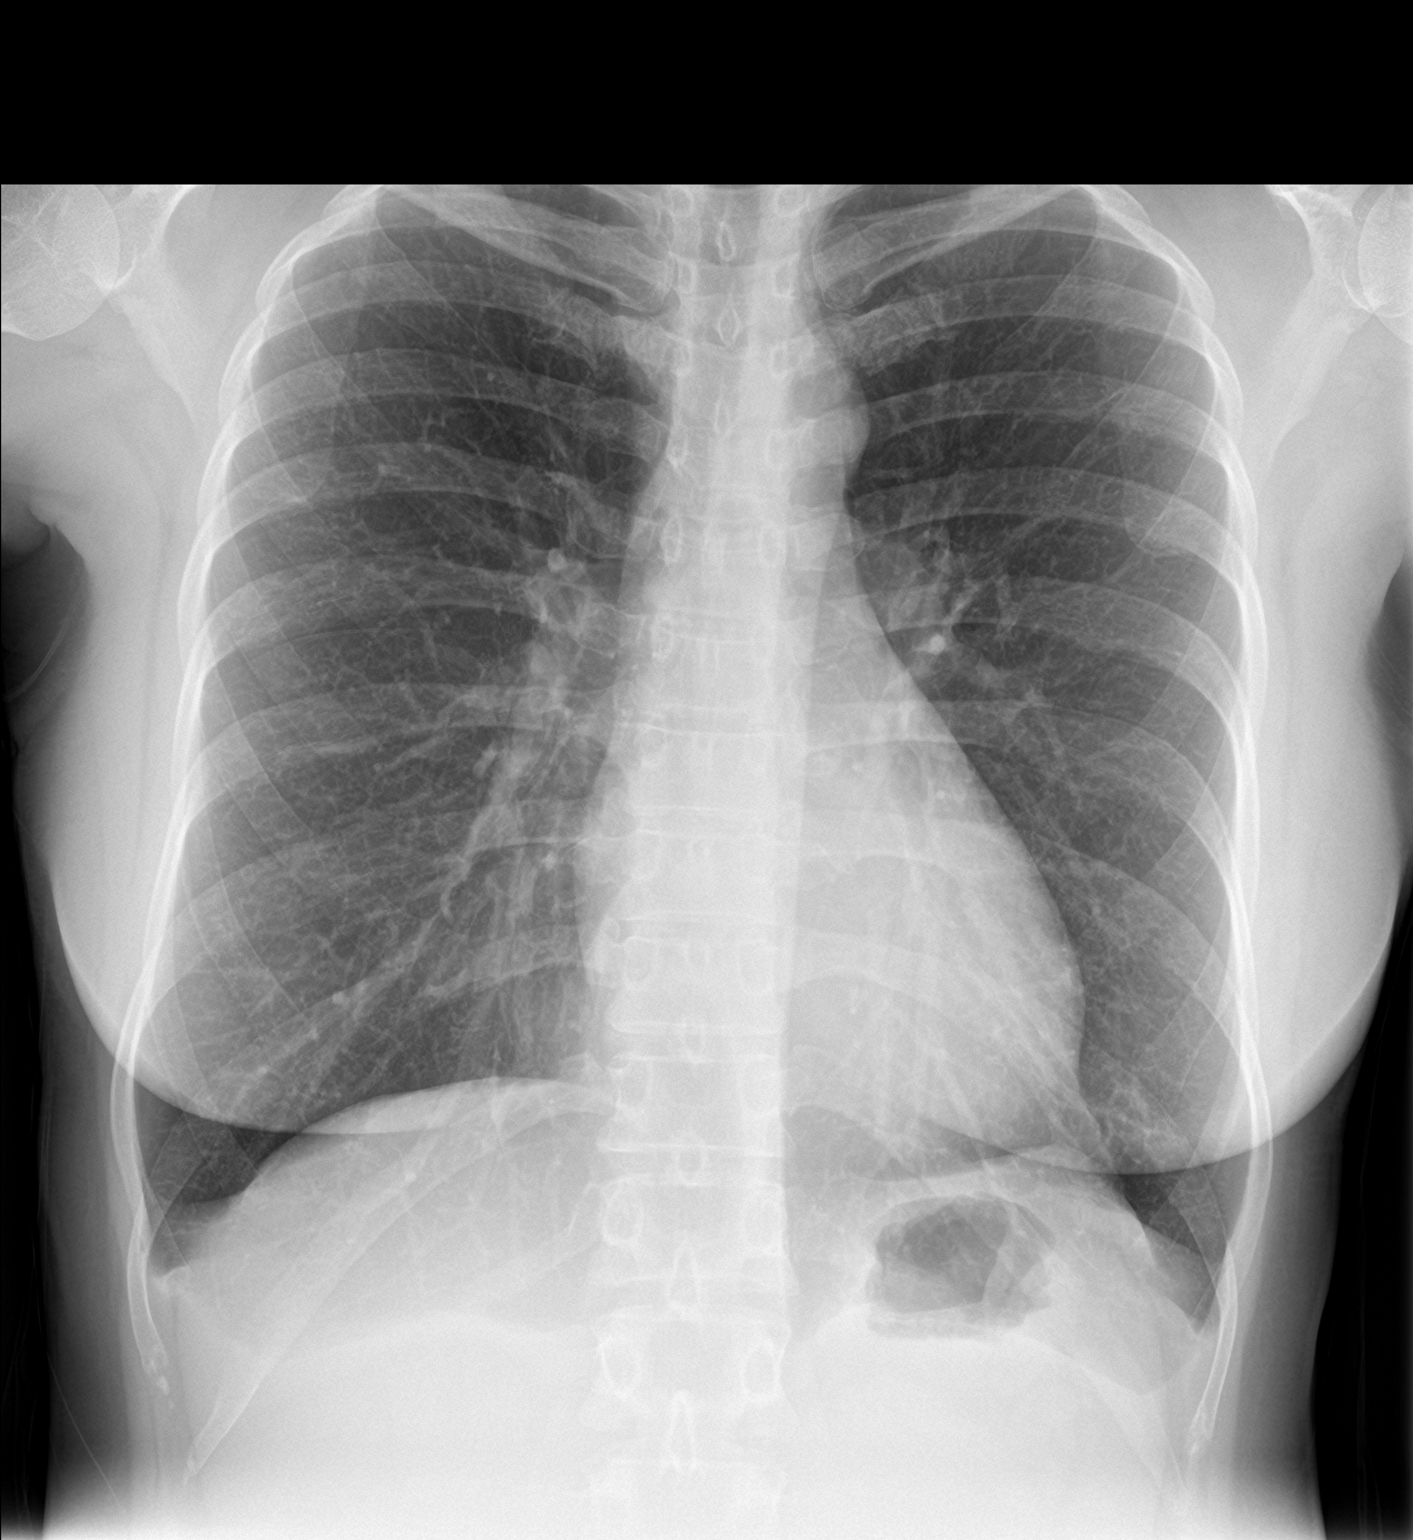

[chest lat]
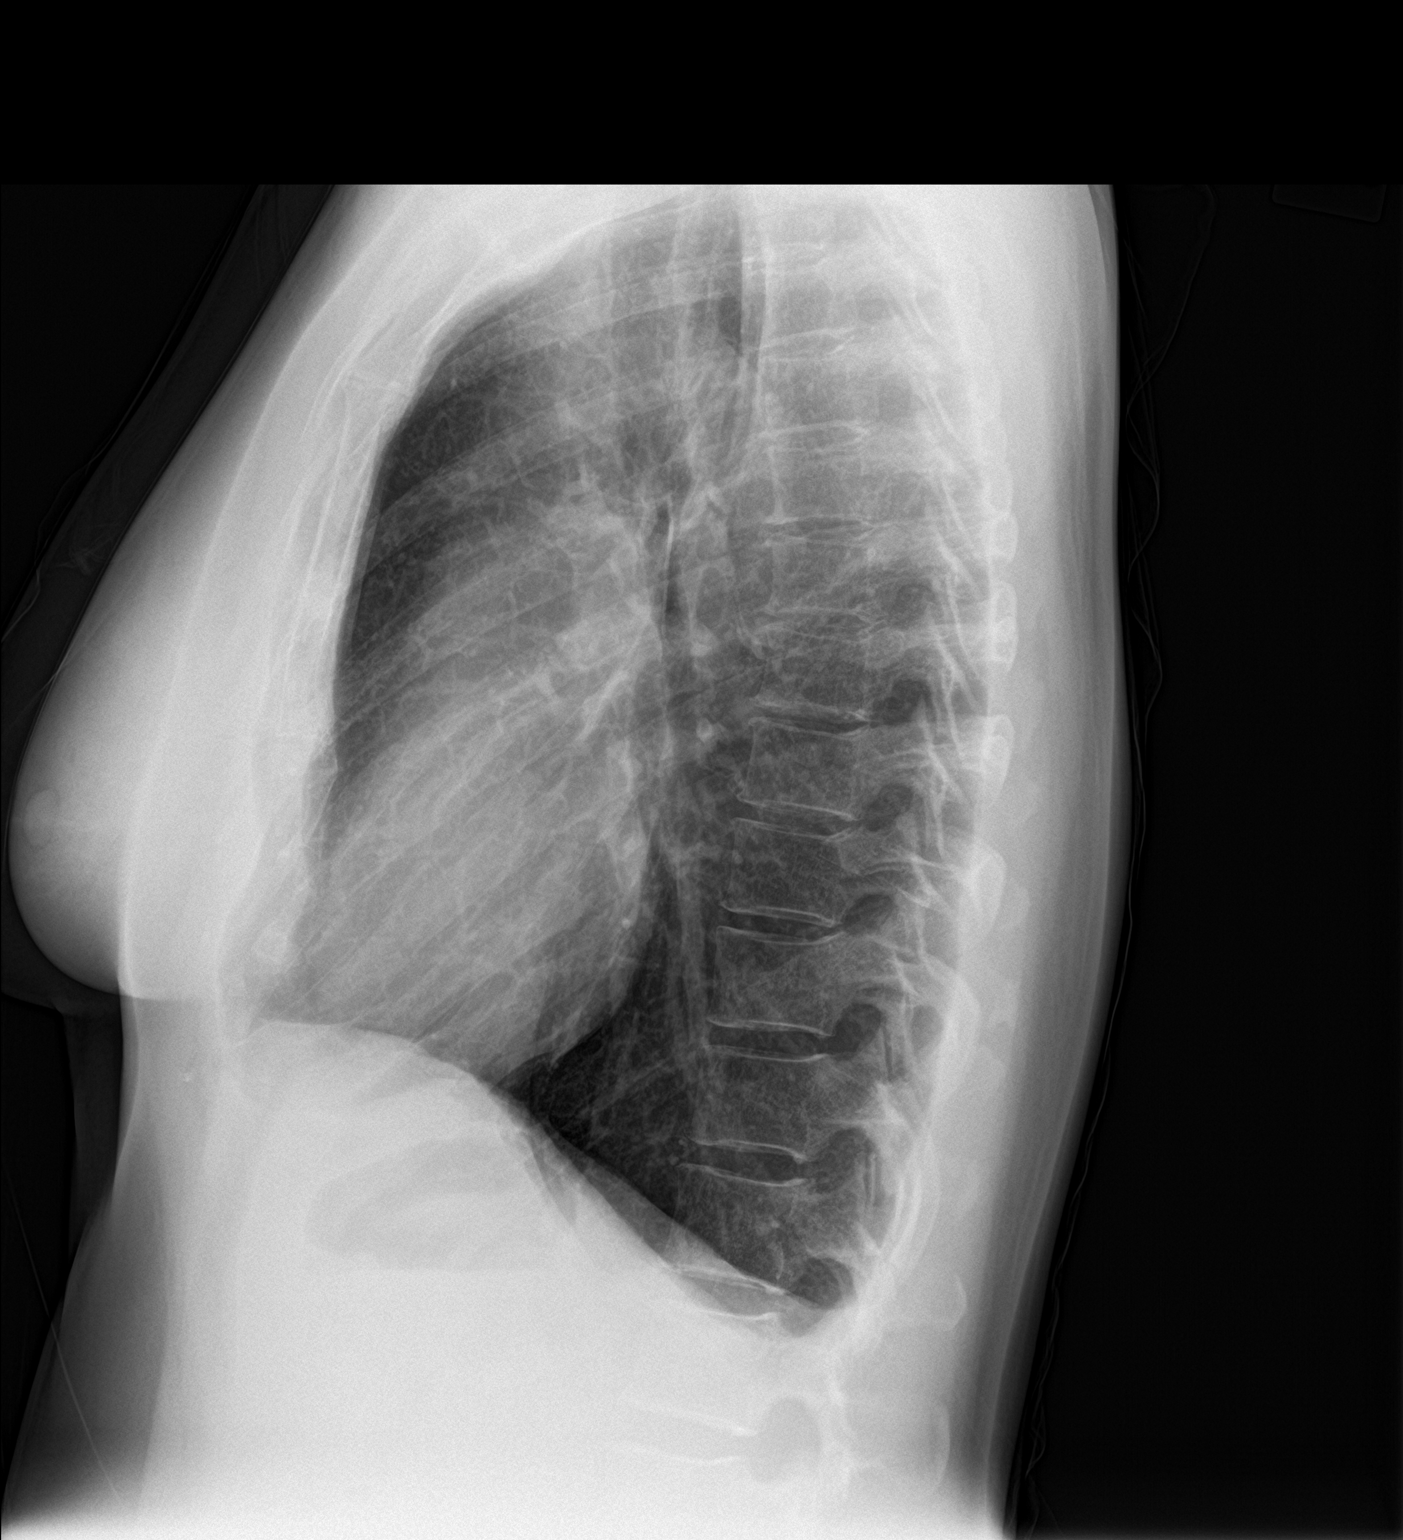

[2 of 2 positions shown; findings below may reference images not displayed]

FINDINGS: Mediastinum and hilar structures normal. Heart size normal. Lungs
are clear. Tiny bilateral pleural effusions versus pleural scarring.
Heart size normal. No acute bony abnormality identified.
IMPRESSION: Tiny bilateral pleural effusions versus pleural scarring.

## 2021-09-25 ENCOUNTER — Encounter: Payer: Self-pay | Admitting: Physician Assistant

## 2021-09-25 ENCOUNTER — Ambulatory Visit: Payer: 59 | Admitting: Physician Assistant

## 2021-09-25 VITALS — BP 110/77 | HR 75 | Temp 98.5°F | Ht 61.0 in | Wt 131.0 lb

## 2021-09-25 DIAGNOSIS — M545 Low back pain, unspecified: Secondary | ICD-10-CM | POA: Diagnosis not present

## 2021-09-25 DIAGNOSIS — R202 Paresthesia of skin: Secondary | ICD-10-CM | POA: Diagnosis not present

## 2021-09-25 LAB — COMPREHENSIVE METABOLIC PANEL
ALT: 15 U/L (ref 0–35)
AST: 17 U/L (ref 0–37)
Albumin: 4.2 g/dL (ref 3.5–5.2)
Alkaline Phosphatase: 74 U/L (ref 39–117)
BUN: 11 mg/dL (ref 6–23)
CO2: 29 mEq/L (ref 19–32)
Calcium: 9 mg/dL (ref 8.4–10.5)
Chloride: 108 mEq/L (ref 96–112)
Creatinine, Ser: 0.59 mg/dL (ref 0.40–1.20)
GFR: 107.64 mL/min (ref >60.00)
Glucose, Bld: 91 mg/dL (ref 70–99)
Potassium: 3.7 mEq/L (ref 3.5–5.1)
Sodium: 142 mEq/L (ref 135–145)
Total Bilirubin: 0.4 mg/dL (ref 0.2–1.2)
Total Protein: 7 g/dL (ref 6.0–8.3)

## 2021-09-25 LAB — VITAMIN B12: Vitamin B-12: >1504 pg/mL — ABNORMAL HIGH (ref 211–911)

## 2021-09-25 LAB — CBC WITH DIFFERENTIAL/PLATELET
Basophils Absolute: 0 10*3/uL (ref 0.0–0.1)
Basophils Relative: 0.4 % (ref 0.0–3.0)
Eosinophils Absolute: 0.2 10*3/uL (ref 0.0–0.7)
Eosinophils Relative: 2.6 % (ref 0.0–5.0)
HCT: 36.8 % (ref 36.0–46.0)
Hemoglobin: 12.2 g/dL (ref 12.0–15.0)
Lymphocytes Relative: 29.9 % (ref 12.0–46.0)
Lymphs Abs: 1.7 10*3/uL (ref 0.7–4.0)
MCHC: 33.3 g/dL (ref 30.0–36.0)
MCV: 91.2 fl (ref 78.0–100.0)
Monocytes Absolute: 0.4 10*3/uL (ref 0.1–1.0)
Monocytes Relative: 6.5 % (ref 3.0–12.0)
Neutro Abs: 3.5 10*3/uL (ref 1.4–7.7)
Neutrophils Relative %: 60.6 % (ref 43.0–77.0)
Platelets: 276 10*3/uL (ref 150.0–400.0)
RBC: 4.03 Mil/uL (ref 3.87–5.11)
RDW: 13.1 % (ref 11.5–15.5)
WBC: 5.8 10*3/uL (ref 4.0–10.5)

## 2021-09-25 LAB — HEMOGLOBIN A1C: Hgb A1c MFr Bld: 5.3 % (ref 4.6–6.5)

## 2021-09-25 LAB — TSH: TSH: 1.56 u[IU]/mL (ref 0.35–5.50)

## 2021-09-25 NOTE — Progress Notes (Signed)
Tracy Bradshaw is a 46 y.o. female here for right hip pain. ? ?History of Present Illness:  ? ?Chief Complaint  ?Patient presents with  ? Numbness  ?  Pt c/o right leg and arm numbness on and off for about 6 months.   ? ? ?HPI ? ?Paraesthesia of Right Leg and Arm ?Abiageal presents with c/o right leg and arm numbness that has been onset for 6 months. Upon further discussion, pt explained that about 6 months ago she was playing with her kids at home when she fell and landed on her left hip. Although she fell on her left side, she shortly began experiencing right leg and arm numbness that radiated entirely through each extremity. Additionally she has noticed that upon inhaling deeply, she would experience back pain that she compares to someone digging in her back. She has not tried to take any medication during these painful episodes, but decided it would be best to have this further evaluated.  ? ?Pt has a hx of right hip pain that she reported to me on 12/25/2019. During this visit she shared that she was experiencing a tightening sensation in her right hip that made it hard for her to go from a sitting to standing position quickly. At the time she denied any recent trauma. While I suggested she participate in PT, she opted to participate in chiropractic care.  ? ?Denies urinary/bowel incontinence, recent illness, or weakness on one side of body.  ? ? ?Past Medical History:  ?Diagnosis Date  ? Abnormal Pap smear   ? HPV  ? Acute blood loss anemia 04/30/2012  ? Blood type O+   ? PONV (postoperative nausea and vomiting)   ? SAB (spontaneous abortion)   ? Uterine fibroid 04/06/2011  ? Submucus   ? ?  ?Social History  ? ?Tobacco Use  ? Smoking status: Never  ? Smokeless tobacco: Never  ?Substance Use Topics  ? Alcohol use: Yes  ?  Comment: very rare  ? Drug use: No  ? ? ?Past Surgical History:  ?Procedure Laterality Date  ? CESAREAN SECTION  04/29/2012  ? Procedure: CESAREAN SECTION;  Surgeon: Elveria Royals, MD;  Location: [facility];  Service: Obstetrics;  Laterality: N/A;  ? CESAREAN SECTION N/A 06/27/2015  ? Procedure: Repeat CESAREAN SECTION;  Surgeon: Aloha Gell, MD;  Location: [facility];  Service: Obstetrics;  Laterality: N/A;  EDD: 06/30/15  ? ? ?Family History  ?Problem Relation Age of Onset  ? Hypertension Mother   ? Heart disease Father 71  ?     Died age 35  ? Heart disease Brother 40  ?     "Big Heart"   ? Other Neg Hx   ? Cancer Neg Hx   ? ? ?No Known Allergies ? ?Current Medications:  ? ?Current Outpatient Medications:  ?  acetaminophen (TYLENOL) 325 MG tablet, Take 2 tablets (650 mg total) by mouth every 4 (four) hours as needed (for pain scale < 4)., Disp: , Rfl:  ?  albuterol (PROVENTIL HFA;VENTOLIN HFA) 108 (90 Base) MCG/ACT inhaler, Inhale 1-2 puffs into the lungs as needed., Disp: , Rfl:  ?  Ascorbic Acid (VITAMIN C GUMMIES PO), Take by mouth. Chew 3 gummies daily, Disp: , Rfl:  ?  ibuprofen (ADVIL) 600 MG tablet, Take 600 mg by mouth as needed., Disp: , Rfl:  ?  Multiple Vitamins-Minerals (MULTIVITAMIN WITH MINERALS) tablet, Take 1 tablet by mouth daily., Disp: , Rfl:  ?  SALINE NASAL MIST NA,  Place into the nose as needed., Disp: , Rfl:   ? ?Review of Systems:  ? ?ROS ?Negative unless otherwise specified per HPI. ?Vitals:  ? ?Vitals:  ? 09/25/21 1305  ?BP: 110/77  ?Pulse: 75  ?Temp: 98.5 ?F (36.9 ?C)  ?TempSrc: Temporal  ?SpO2: 98%  ?Weight: 131 lb (59.4 kg)  ?Height: '5\' 1"'$  (1.549 m)  ?   ?Body mass index is 24.75 kg/m?. ? ?Physical Exam:  ? ?Physical Exam ?Vitals and nursing note reviewed.  ?Constitutional:   ?   General: She is not in acute distress. ?   Appearance: She is well-developed. She is not ill-appearing or toxic-appearing.  ?Cardiovascular:  ?   Rate and Rhythm: Normal rate and regular rhythm.  ?   Pulses: Normal pulses.  ?   Heart sounds: Normal heart sounds, S1 normal and S2 normal.  ?Pulmonary:  ?   Effort: Pulmonary effort is normal.  ?   Breath sounds: Normal breath sounds.  ?Musculoskeletal:  ?    Comments: No decreased ROM 2/2 pain with flexion/extension, lateral side bends, or rotation. Slight tenderness to lumbar area, no severe bony tenderness, does have paraspinal tenderness.  ?  ?Skin: ?   General: Skin is warm and dry.  ?Neurological:  ?   Mental Status: She is alert.  ?   GCS: GCS eye subscore is 4. GCS verbal subscore is 5. GCS motor subscore is 6.  ?Psychiatric:     ?   Speech: Speech normal.     ?   Behavior: Behavior normal. Behavior is cooperative.  ? ? ?Assessment and Plan:  ? ?Lumbar pain; Paresthesias ?No red flags  ?May use ibuprofen as needed for pain/inflammation ?Placed referral to sports medicine for further evaluation ?Update labs to r/o organic cause of paresthesias per patient request, will make recommendations accordingly  ? ?I,Havlyn C Ratchford,acting as a scribe for [employer], PA.,have documented all relevant documentation on the behalf of Inda Coke, PA,as directed by  Inda Coke, PA while in the presence of Inda Coke, [place]. ? ?IInda Coke, PA, have reviewed all documentation for this visit. The documentation on 09/25/21 for the exam, diagnosis, procedures, and orders are all accurate and complete. ? ? ?Inda Coke, PA-C ? ?

## 2021-09-25 NOTE — Patient Instructions (Signed)
It was great to see you! ? ?A referral has been placed for you to see one of the [facility] Providers. ?Someone from their office will be in touch soon regarding your appointment with him. ?His location: [place] at [facility] [address] on the 1st floor.   ?Phone number [phone], Fax [phone].  ?This location is across the street from the entrance to [employer] and in the same complex as the [facility] and [facility] ? ?Blood work today ? ?Try ibuprofen for your symptoms over the weekend ? ?Take care, ? ?Inda Coke PA-C  ?

## 2021-09-28 ENCOUNTER — Ambulatory Visit (INDEPENDENT_AMBULATORY_CARE_PROVIDER_SITE_OTHER): Payer: 59

## 2021-09-28 ENCOUNTER — Ambulatory Visit: Payer: 59 | Admitting: Family Medicine

## 2021-09-28 VITALS — BP 130/86 | HR 94 | Ht 61.0 in | Wt 131.6 lb

## 2021-09-28 DIAGNOSIS — M25551 Pain in right hip: Secondary | ICD-10-CM

## 2021-09-28 DIAGNOSIS — G8929 Other chronic pain: Secondary | ICD-10-CM

## 2021-09-28 DIAGNOSIS — M5441 Lumbago with sciatica, right side: Secondary | ICD-10-CM

## 2021-09-28 MED ORDER — GABAPENTIN 300 MG PO CAPS: 300.0000 mg | ORAL_CAPSULE | Freq: Three times a day (TID) | ORAL | 2 refills | Status: DC

## 2021-09-28 NOTE — Patient Instructions (Signed)
Nice to meet you. ? ?Gabapentin 300 mg  ? ?I've referred you to Physical Therapy at [facility].  Their office will call you to schedule but please let us know if you don't hear from them in one week regarding scheduling. ? ?Please get an Xray today before you leave. ? ?Follow-up: 6 weeks ?

## 2021-09-28 NOTE — Progress Notes (Signed)
? ?I, Peterson Lombard, LAT, ATC acting as a scribe for Lynne Leader, MD. ? ?Subjective:   ? ?CC: Low back pain ? ?HPI: Pt is a 46 y/o female c/o low back pain ongoing for 6+months after she fell on her L hip while playing w/ her kids. Pt locates pain to R side of her low back, through lateral hip, radiating along the anterior aspect of her R leg to R foot ?She also has some pain into the anterior hip.  Pain is worse typically with prolonged sitting. ? ?Radiating pain: yes- into R leg ?LE numbness/tingling: yes- R leg and R arm ?LE weakness: no ?Aggravates: sitting for prolonged period ?Treatments tried: chiro,  ? ?Pertinent review of Systems: No fevers or chills ? ?Relevant historical information: Tachycardia ? ? ?Objective:   ? ?Vitals:  ? 09/28/21 1302  ?BP: 130/86  ?Pulse: 94  ? ?General: Well Developed, well nourished, and in no acute distress.  ? ?MSK: L-spine: Nontender midline. ?Normal appearing ?Normal range of motion. ?Negative slump test. ?X-ray strength is intact except noted below. ? ?Right hip: ?Normal appearing ?Not particularly tender to palpation. ?Slightly limited flexion and internal rotation with minimal pain. ?Hip abduction and external rotation strength are mildly diminished 4+/5. ? ? ? ?Lab and Radiology Results ? ?X-ray images L-spine and right hip taken today personally and independently interpreted ? ?L-spine: Minimal DDD.  Minimal facet DJD.  No acute fractures. ? ?Right hip: No significant DJD.  No acute fractures. ? ?Await formal radiology review ? ? ?Impression and Recommendations:   ? ?Assessment and Plan: ?46 y.o. female with right leg pain.  Multifactorial.  She has some evidence of L5 lumbar radiculopathy and some evidence of anterior hip pain thought to be perhaps labrum tear or hip impingement and some lateral hip pain thought to be hip abductor tendinopathy.  She is a good candidate for physical therapy.  Plan to refer to PT.  Additionally limited gabapentin trial.  Backup plan  for prednisone that I can prescribe if needed if her radicular symptoms worsen.  Recheck in about 6 weeks.. ? ?PDMP not reviewed this encounter. ?Orders Placed This Encounter  ?Procedures  ? DG Lumbar Spine 2-3 Views  ?  Standing Status:   Future  ?  Number of Occurrences:   1  ?  Standing Expiration Date:   10/29/2021  ?  Order Specific Question:   Reason for Exam (SYMPTOM  OR DIAGNOSIS REQUIRED)  ?  Answer:   low back pain  ?  Order Specific Question:   Preferred imaging location?  ?  Answer:   [facility]  ?  Order Specific Question:   Is patient pregnant?  ?  Answer:   No  ? DG HIP UNILAT W OR W/O PELVIS 2-3 VIEWS RIGHT  ?  Standing Status:   Future  ?  Number of Occurrences:   1  ?  Standing Expiration Date:   10/29/2021  ?  Order Specific Question:   Reason for Exam (SYMPTOM  OR DIAGNOSIS REQUIRED)  ?  Answer:   R hip pain  ?  Order Specific Question:   Is patient pregnant?  ?  Answer:   No  ?  Order Specific Question:   Preferred imaging location?  ?  Answer:   [facility]  ? Ambulatory referral to Physical Therapy  ?  Referral Priority:   Routine  ?  Referral Type:   Physical Medicine  ?  Referral Reason:   Specialty  Services Required  ?  Requested Specialty:   Physical Therapy  ?  Number of Visits Requested:   1  ? ?Meds ordered this encounter  ?Medications  ? gabapentin (NEURONTIN) 300 MG capsule  ?  Sig: Take 1 capsule (300 mg total) by mouth 3 (three) times daily.  ?  Dispense:  90 capsule  ?  Refill:  2  ? ? ?Discussed warning signs or symptoms. Please see discharge instructions. Patient expresses understanding. ? ? ?The above documentation has been reviewed and is accurate and complete Lynne Leader, M.D. ? ?

## 2021-09-29 NOTE — Progress Notes (Signed)
Right hip x-ray looks normal to radiology

## 2021-09-29 NOTE — Progress Notes (Signed)
X-ray lumbar spine shows mild arthritis changes.

## 2021-10-07 NOTE — Therapy (Addendum)
OUTPATIENT PHYSICAL THERAPY LOWER EXTREMITY EVALUATION   Patient Name: Tracy Bradshaw MRN: [id number] DOB:1975-07-14, 46 y.o., female Today's Date: 10/08/2021   PT End of Session - 10/08/21 1349     Visit Number 1    Number of Visits 12    Date for PT Re-Evaluation 11/19/21    Progress Note Due on Visit 10    PT Start Time 9458    PT Stop Time 1425    PT Time Calculation (min) 36 min             Past Medical History:  Diagnosis Date   Abnormal Pap smear    HPV   Acute blood loss anemia 04/30/2012   Blood type O+    PONV (postoperative nausea and vomiting)    SAB (spontaneous abortion)    Uterine fibroid 04/06/2011   Submucus    Past Surgical History:  Procedure Laterality Date   CESAREAN SECTION  04/29/2012   Procedure: CESAREAN SECTION;  Surgeon: Elveria Royals, MD;  Location: [facility];  Service: Obstetrics;  Laterality: N/A;   CESAREAN SECTION N/A 06/27/2015   Procedure: Repeat CESAREAN SECTION;  Surgeon: Aloha Gell, MD;  Location: [facility];  Service: Obstetrics;  Laterality: N/A;  EDD: 06/30/15   Patient Active Problem List   Diagnosis Date Noted   Tachycardia 05/17/2019   Nonspecific abnormal electrocardiogram (ECG) (EKG) 05/17/2019    PCP: Inda Coke, PA  REFERRING PROVIDER: Lynne Leader, MD  REFERRING DIAG: [phone] (ICD-10-CM) - Chronic low back pain with right-sided sciatica, unspecified back pain laterality M25.551 (ICD-10-CM) - Right hip pain  THERAPY DIAG:  Other low back pain  Muscle weakness (generalized)  ONSET DATE: > 6 months ago  SUBJECTIVE:   SUBJECTIVE STATEMENT: States that she fell when she was playing with her kids over 6 months.  States her right leg and arm were painful prior to the fall and she went to a chiropractor and it fell better. States after the fall she started having pain and numbness in both the right arm and leg. States that she did not talk to the MD about her arm as it only recently started bothering her. States  that sitting for long periods of time when she transitions to standing her hip hurts. Lifting things bothers her arm. States she has tried ice with minimal relief. States when she gets numb it goes all the way to her toes and feels like pins and needles. States that after sitting 10 minutes she has difficulties getting up from chair. Notes that she did have right hip pain when she was pregnant with her first son 10 years ago.   Fell 6+ months ago on left side and having right sided back pain radiating into right leg.   PERTINENT HISTORY: No significant history  PAIN:  Are you having pain? Yes: NPRS scale: unable to give number/10 Pain location: right leg Pain description: numb, painful, tingling Aggravating factors: sitting for 10 minutes  Relieving factors: no none factors   PRECAUTIONS: None  WEIGHT BEARING RESTRICTIONS No  FALLS:  Has patient fallen in last 6 months? No  OCCUPATION: works in Estate manager/land agent   PLOF: [place] to have less pain   OBJECTIVE:   DIAGNOSTIC FINDINGS:  09/28/21 Xray lumbar IMPRESSION: Mild degenerative changes without evidence of fractures or malalignment.   Xray right hip/pelvis IMPRESSION: Negative.   COGNITION:  Overall cognitive status: Within functional limits for tasks assessed     SENSATION: WFL    POSTURE:  Sacral sitting, forward head/shoulders  PALPATION: Tenderness to palpation along lumbar paraspinals, glutes on right. Hypomobility and pain with spring testing throughout lumbar spine     Lumbar  A/PROM:    10/08/2021      Flexion  50% limited     Extension 25% limited*      R ROT       L ROT       R SB  25% limited    L SB 25% limited     * Pain   (Blank rows = not tested)   LE Measurements Lower Extremity Right 10/08/2021 Left 10/08/2021   A/PROM MMT A/PROM MMT  Hip Flexion WNL 3+ WNL 4-  Hip Extension WNL 3+* WNL 4  Hip Abduction WNL 3+* WNL 4  Hip Adduction      Hip Internal rotation WNL   WNL   Hip External rotation WNL  WNL   Knee Flexion  4-  4  Knee Extension  4  4+  Ankle Dorsiflexion  5  5  Ankle Plantarflexion      Ankle Inversion      Ankle Eversion       (Blank rows = not tested)  * pain   LOWER EXTREMITY SPECIAL TESTS:  Slump test negative bilaterally, SLR test neg bilaterally, Ely's test + R, Scour Neg bilaterally    TODAY'S TREATMENT: 10/08/2021  Therapeutic Exercise:  Aerobic: Supine: Prone: lying, prone press ups x10 5" holds, hamstring curls x15 bilateral hip extension x10 bilaterally  Seated:  Standing: Neuromuscular Re-education: Manual Therapy: Therapeutic Activity: Self Care: Trigger Point Dry Needling:  Modalities:     PATIENT EDUCATION:  Education details: on current presentation, on HEP, on clinical outcomes score and POC, on rationale for interventions, on importance of lumbar support/towel roll in sitting and avoiding sacral sitting Person educated: Patient Education method: Explanation, Demonstration, and Handouts Education comprehension: verbalized understanding    HOME EXERCISE PROGRAM: [id number]  ASSESSMENT:  CLINICAL IMPRESSION: Patient is a 46 y.o. female who was seen today for physical therapy evaluation and treatment for lumbar pain and right LE pain. Patient presents with weakness, ROM deficits and pain with palpation along right glutes and lumbar spine. Educated patient in current presentation and importance of support in sitting and performing opposite motion flexion. Patient tolerated session well reporting no pain end of session and support with towel roll in lumbar spine with sitting. Patient would benefit from skilled PT to improve overall function and QOL.   OBJECTIVE IMPAIRMENTS decreased activity tolerance, decreased ROM, decreased strength, improper body mechanics, postural dysfunction, and pain.   ACTIVITY LIMITATIONS cleaning, driving, and shopping.   PERSONAL FACTORS Education, Past/current experiences,  and Time since onset of injury/illness/exacerbation are also affecting patient's functional outcome.    REHAB POTENTIAL: Good  CLINICAL DECISION MAKING: Stable/uncomplicated  EVALUATION COMPLEXITY: Low  GOALS: Goals reviewed with patient?  yes  SHORT TERM GOALS:  Patient will be independent in self management strategies to improve quality of life and functional outcomes. Baseline: new program Target date: 10/29/2021 Goal status: INITIAL  2.  Patient will report at least 50% improvement in overall symptoms and/or function to demonstrate improved functional mobility Baseline: 0% Target date: 10/29/2021 Goal status: INITIAL  3.  Patient will demonstrate pain free lumbar ROM Baseline: painful Target date: 10/29/2021 Goal status: INITIAL      LONG TERM GOALS:  Patient will report at least 75% improvement in overall symptoms and/or function to demonstrate improved functional mobility Baseline: 0% Target  date: 11/19/2021 Goal status: INITIAL  2.  Patient will be able to sit for at least 30 minutes without having pain afterwards when transitioning from sit to stand Baseline: painful Target date: 11/19/2021 Goal status: INITIAL  3.  Patient will demonstrate at least 4/5 MM in bilateral LE to demonstrate improved strength Baseline: see above Target date: 11/19/2021 Goal status: INITIAL   PLAN: PT FREQUENCY: 2x/week  PT DURATION: 6 weeks  PLANNED INTERVENTIONS: Therapeutic exercises, Therapeutic activity, Neuromuscular re-education, Balance training, Gait training, Patient/Family education, Joint mobilization, Stair training, Aquatic Therapy, Dry Needling, Electrical stimulation, Cryotherapy, Moist heat, Ionotophoresis 32m/ml Dexamethasone, and Manual therapy  PLAN FOR NEXT SESSION: hip strength/mobility, lumbar strength/mobility, repeated press ups, manual to right hip/lumbar STM, assess shoulder when indicated by MD  2:36 PM, 10/08/21 MJerene Pitch DPT Physical Therapy  with [facility]  PHYSICAL THERAPY DISCHARGE SUMMARY  Visits from Start of Care: 1 Plan: Patient agrees to discharge.  Patient goals were not met. Patient is being discharged due to - not returning since last visit.   Discharge updated today to close old episode. Signing therapist did not treat patient. Goals and progress not updated, just doing d/c.      LLyndee Hensen PT, DPT 11:05 AM  04/16/22

## 2021-10-08 ENCOUNTER — Encounter: Payer: Self-pay | Admitting: Physical Therapy

## 2021-10-08 ENCOUNTER — Other Ambulatory Visit: Payer: Self-pay

## 2021-10-08 ENCOUNTER — Ambulatory Visit: Payer: 59 | Admitting: Physical Therapy

## 2021-10-08 DIAGNOSIS — M6281 Muscle weakness (generalized): Secondary | ICD-10-CM | POA: Diagnosis not present

## 2021-10-08 DIAGNOSIS — M5459 Other low back pain: Secondary | ICD-10-CM | POA: Diagnosis not present

## 2021-10-08 NOTE — Therapy (Deleted)
OUTPATIENT PHYSICAL THERAPY TREATMENT NOTE   Patient Name: Tracy Bradshaw MRN: [id number] DOB:05/16/1976, 46 y.o., female Today's Date: 10/08/2021    END OF SESSION:    Past Medical History:  Diagnosis Date   Abnormal Pap smear    HPV   Acute blood loss anemia 04/30/2012   Blood type O+    PONV (postoperative nausea and vomiting)    SAB (spontaneous abortion)    Uterine fibroid 04/06/2011   Submucus    Past Surgical History:  Procedure Laterality Date   CESAREAN SECTION  04/29/2012   Procedure: CESAREAN SECTION;  Surgeon: Elveria Royals, MD;  Location: [facility];  Service: Obstetrics;  Laterality: N/A;   CESAREAN SECTION N/A 06/27/2015   Procedure: Repeat CESAREAN SECTION;  Surgeon: Aloha Gell, MD;  Location: [facility];  Service: Obstetrics;  Laterality: N/A;  EDD: 06/30/15   Patient Active Problem List   Diagnosis Date Noted   Tachycardia 05/17/2019   Nonspecific abnormal electrocardiogram (ECG) (EKG) 05/17/2019    PCP: Inda Coke, PA  REFERRING PROVIDER: Lynne Leader, MD  REFERRING DIAG: [phone] (ICD-10-CM) - Chronic low back pain with right-sided sciatica, unspecified back pain laterality M25.551 (ICD-10-CM) - Right hip pain  THERAPY DIAG:  Other low back pain  Muscle weakness (generalized)  ONSET DATE: > 6 months ago  SUBJECTIVE:   SUBJECTIVE STATEMENT: States that she fell when she was playing with her kids over 6 months.  States her right leg and arm were painful prior to the fall and she went to a chiropractor and it fell better. States after the fall she started having pain and numbness in both the right arm and leg. States that she did not talk to the MD about her arm as it only recently started bothering her. States that sitting for long periods of time when she transitions to standing her hip hurts. Lifting things bothers her arm. States she has tried ice with minimal relief. States when she gets numb it goes all the way to her toes and feels like pins  and needles. States that after sitting 10 minutes she has difficulties getting up from chair. Notes that she did have right hip pain when she was pregnant with her first son 10 years ago.   Fell 6+ months ago on left side and having right sided back pain radiating into right leg.   PERTINENT HISTORY: No significant history  PAIN:  Are you having pain? Yes: NPRS scale: unable to give number/10 Pain location: right leg Pain description: numb, painful, tingling Aggravating factors: sitting for 10 minutes  Relieving factors: no none factors   PRECAUTIONS: None  WEIGHT BEARING RESTRICTIONS No  FALLS:  Has patient fallen in last 6 months? No  OCCUPATION: works in Estate manager/land agent   PLOF: [place] to have less pain   OBJECTIVE:   DIAGNOSTIC FINDINGS:  09/28/21 Xray lumbar IMPRESSION: Mild degenerative changes without evidence of fractures or malalignment.   Xray right hip/pelvis IMPRESSION: Negative.   COGNITION:  Overall cognitive status: Within functional limits for tasks assessed     SENSATION: WFL    POSTURE:  Sacral sitting, forward head/shoulders  PALPATION: Tenderness to palpation along lumbar paraspinals, glutes on right. Hypomobility and pain with spring testing throughout lumbar spine     Lumbar  A/PROM:    10/08/2021      Flexion  50% limited     Extension 25% limited*      R ROT       L ROT  R SB  25% limited    L SB 25% limited     * Pain   (Blank rows = not tested)   LE Measurements Lower Extremity Right 10/08/2021 Left 10/08/2021   A/PROM MMT A/PROM MMT  Hip Flexion WNL 3+ WNL 4-  Hip Extension WNL 3+* WNL 4  Hip Abduction WNL 3+* WNL 4  Hip Adduction      Hip Internal rotation WNL  WNL   Hip External rotation WNL  WNL   Knee Flexion  4-  4  Knee Extension  4  4+  Ankle Dorsiflexion  5  5  Ankle Plantarflexion      Ankle Inversion      Ankle Eversion       (Blank rows = not tested)  * pain   LOWER EXTREMITY  SPECIAL TESTS:  Slump test negative bilaterally, SLR test neg bilaterally, Ely's test + R, Scour Neg bilaterally    TODAY'S TREATMENT: 10/08/2021  Therapeutic Exercise:  Aerobic: Supine: Prone: lying, prone press ups x10 5" holds, hamstring curls x15 bilateral hip extension x10 bilaterally  Seated:  Standing: Neuromuscular Re-education: Manual Therapy: Therapeutic Activity: Self Care: Trigger Point Dry Needling:  Modalities:     PATIENT EDUCATION:  Education details: on current presentation, on HEP, on clinical outcomes score and POC, on rationale for interventions, on importance of lumbar support/towel roll in sitting and avoiding sacral sitting Person educated: Patient Education method: Explanation, Demonstration, and Handouts Education comprehension: verbalized understanding    HOME EXERCISE PROGRAM: [id number]  ASSESSMENT:  CLINICAL IMPRESSION: Patient is a 46 y.o. female who was seen today for physical therapy evaluation and treatment for lumbar pain and right LE pain. Patient presents with weakness, ROM deficits and pain with palpation along right glutes and lumbar spine. Educated patient in current presentation and importance of support in sitting and performing opposite motion flexion. Patient tolerated session well reporting no pain end of session and support with towel roll in lumbar spine with sitting. Patient would benefit from skilled PT to improve overall function and QOL.   OBJECTIVE IMPAIRMENTS decreased activity tolerance, decreased ROM, decreased strength, improper body mechanics, postural dysfunction, and pain.   ACTIVITY LIMITATIONS cleaning, driving, and shopping.   PERSONAL FACTORS Education, Past/current experiences, and Time since onset of injury/illness/exacerbation are also affecting patient's functional outcome.    REHAB POTENTIAL: Good  CLINICAL DECISION MAKING: Stable/uncomplicated  EVALUATION COMPLEXITY: Low  GOALS: Goals reviewed with  patient?  yes  SHORT TERM GOALS:  Patient will be independent in self management strategies to improve quality of life and functional outcomes. Baseline: new program Target date: 10/29/2021 Goal status: INITIAL  2.  Patient will report at least 50% improvement in overall symptoms and/or function to demonstrate improved functional mobility Baseline: 0% Target date: 10/29/2021 Goal status: INITIAL  3.  Patient will demonstrate pain free lumbar ROM Baseline: painful Target date: 10/29/2021 Goal status: INITIAL      LONG TERM GOALS:  Patient will report at least 75% improvement in overall symptoms and/or function to demonstrate improved functional mobility Baseline: 0% Target date: 11/19/2021 Goal status: INITIAL  2.  Patient will be able to sit for at least 30 minutes without having pain afterwards when transitioning from sit to stand Baseline: painful Target date: 11/19/2021 Goal status: INITIAL  3.  Patient will demonstrate at least 4/5 MM in bilateral LE to demonstrate improved strength Baseline: see above Target date: 11/19/2021 Goal status: INITIAL   PLAN: PT FREQUENCY: 2x/week  PT DURATION:  6 weeks  PLANNED INTERVENTIONS: Therapeutic exercises, Therapeutic activity, Neuromuscular re-education, Balance training, Gait training, Patient/Family education, Joint mobilization, Stair training, Aquatic Therapy, Dry Needling, Electrical stimulation, Cryotherapy, Moist heat, Ionotophoresis '4mg'$ /ml Dexamethasone, and Manual therapy  PLAN FOR NEXT SESSION: hip strength/mobility, lumbar strength/mobility, repeated press ups, manual to right hip/lumbar STM, assess shoulder when indicated by MD  2:38 PM, 10/08/21 Jerene Pitch, DPT Physical Therapy with [facility]

## 2021-10-13 ENCOUNTER — Encounter: Payer: 59 | Admitting: Physical Therapy

## 2021-10-14 ENCOUNTER — Encounter: Payer: 59 | Admitting: Physical Therapy

## 2021-10-19 ENCOUNTER — Encounter: Payer: 59 | Admitting: Physical Therapy

## 2021-10-19 NOTE — Therapy (Deleted)
OUTPATIENT PHYSICAL THERAPY TREATMENT NOTE   Patient Name: Tracy Bradshaw MRN: [id number] DOB:08/24/1975, 46 y.o., female Today's Date: 10/19/2021    END OF SESSION:    Past Medical History:  Diagnosis Date   Abnormal Pap smear    HPV   Acute blood loss anemia 04/30/2012   Blood type O+    PONV (postoperative nausea and vomiting)    SAB (spontaneous abortion)    Uterine fibroid 04/06/2011   Submucus    Past Surgical History:  Procedure Laterality Date   CESAREAN SECTION  04/29/2012   Procedure: CESAREAN SECTION;  Surgeon: Elveria Royals, MD;  Location: [facility];  Service: Obstetrics;  Laterality: N/A;   CESAREAN SECTION N/A 06/27/2015   Procedure: Repeat CESAREAN SECTION;  Surgeon: Aloha Gell, MD;  Location: [facility];  Service: Obstetrics;  Laterality: N/A;  EDD: 06/30/15   Patient Active Problem List   Diagnosis Date Noted   Tachycardia 05/17/2019   Nonspecific abnormal electrocardiogram (ECG) (EKG) 05/17/2019    PCP: Inda Coke, PA  REFERRING PROVIDER: Lynne Leader, MD  REFERRING DIAG: [phone] (ICD-10-CM) - Chronic low back pain with right-sided sciatica, unspecified back pain laterality M25.551 (ICD-10-CM) - Right hip pain  THERAPY DIAG:  Other low back pain  Muscle weakness (generalized)  ONSET DATE: > 6 months ago  SUBJECTIVE:   SUBJECTIVE STATEMENT: States that she fell when she was playing with her kids over 6 months.  States her right leg and arm were painful prior to the fall and she went to a chiropractor and it fell better. States after the fall she started having pain and numbness in both the right arm and leg. States that she did not talk to the MD about her arm as it only recently started bothering her. States that sitting for long periods of time when she transitions to standing her hip hurts. Lifting things bothers her arm. States she has tried ice with minimal relief. States when she gets numb it goes all the way to her toes and feels like pins  and needles. States that after sitting 10 minutes she has difficulties getting up from chair. Notes that she did have right hip pain when she was pregnant with her first son 10 years ago.   Fell 6+ months ago on left side and having right sided back pain radiating into right leg.   PERTINENT HISTORY: No significant history  PAIN:  Are you having pain? Yes: NPRS scale: unable to give number/10 Pain location: right leg Pain description: numb, painful, tingling Aggravating factors: sitting for 10 minutes  Relieving factors: no none factors   PRECAUTIONS: None  WEIGHT BEARING RESTRICTIONS No  FALLS:  Has patient fallen in last 6 months? No  OCCUPATION: works in Estate manager/land agent   PLOF: [place] to have less pain   OBJECTIVE:   DIAGNOSTIC FINDINGS:  09/28/21 Xray lumbar IMPRESSION: Mild degenerative changes without evidence of fractures or malalignment.   Xray right hip/pelvis IMPRESSION: Negative.   COGNITION:  Overall cognitive status: Within functional limits for tasks assessed     SENSATION: WFL    POSTURE:  Sacral sitting, forward head/shoulders  PALPATION: Tenderness to palpation along lumbar paraspinals, glutes on right. Hypomobility and pain with spring testing throughout lumbar spine     Lumbar  A/PROM:    10/08/2021      Flexion  50% limited     Extension 25% limited*      R ROT       L ROT  R SB  25% limited    L SB 25% limited     * Pain   (Blank rows = not tested)   LE Measurements Lower Extremity Right 10/08/2021 Left 10/08/2021   A/PROM MMT A/PROM MMT  Hip Flexion WNL 3+ WNL 4-  Hip Extension WNL 3+* WNL 4  Hip Abduction WNL 3+* WNL 4  Hip Adduction      Hip Internal rotation WNL  WNL   Hip External rotation WNL  WNL   Knee Flexion  4-  4  Knee Extension  4  4+  Ankle Dorsiflexion  5  5  Ankle Plantarflexion      Ankle Inversion      Ankle Eversion       (Blank rows = not tested)  * pain   LOWER EXTREMITY  SPECIAL TESTS:  Slump test negative bilaterally, SLR test neg bilaterally, Ely's test + R, Scour Neg bilaterally    TODAY'S TREATMENT: 10/08/2021  Therapeutic Exercise:  Aerobic: Supine: Prone: lying, prone press ups x10 5" holds, hamstring curls x15 bilateral hip extension x10 bilaterally  Seated:  Standing: Neuromuscular Re-education: Manual Therapy: Therapeutic Activity: Self Care: Trigger Point Dry Needling:  Modalities:     PATIENT EDUCATION:  Education details: on current presentation, on HEP, on clinical outcomes score and POC, on rationale for interventions, on importance of lumbar support/towel roll in sitting and avoiding sacral sitting Person educated: Patient Education method: Explanation, Demonstration, and Handouts Education comprehension: verbalized understanding    HOME EXERCISE PROGRAM: [id number]  ASSESSMENT:  CLINICAL IMPRESSION: Patient is a 46 y.o. female who was seen today for physical therapy evaluation and treatment for lumbar pain and right LE pain. Patient presents with weakness, ROM deficits and pain with palpation along right glutes and lumbar spine. Educated patient in current presentation and importance of support in sitting and performing opposite motion flexion. Patient tolerated session well reporting no pain end of session and support with towel roll in lumbar spine with sitting. Patient would benefit from skilled PT to improve overall function and QOL.   OBJECTIVE IMPAIRMENTS decreased activity tolerance, decreased ROM, decreased strength, improper body mechanics, postural dysfunction, and pain.   ACTIVITY LIMITATIONS cleaning, driving, and shopping.   PERSONAL FACTORS Education, Past/current experiences, and Time since onset of injury/illness/exacerbation are also affecting patient's functional outcome.    REHAB POTENTIAL: Good  CLINICAL DECISION MAKING: Stable/uncomplicated  EVALUATION COMPLEXITY: Low  GOALS: Goals reviewed with  patient?  yes  SHORT TERM GOALS:  Patient will be independent in self management strategies to improve quality of life and functional outcomes. Baseline: new program Target date: 10/29/2021 Goal status: INITIAL  2.  Patient will report at least 50% improvement in overall symptoms and/or function to demonstrate improved functional mobility Baseline: 0% Target date: 10/29/2021 Goal status: INITIAL  3.  Patient will demonstrate pain free lumbar ROM Baseline: painful Target date: 10/29/2021 Goal status: INITIAL      LONG TERM GOALS:  Patient will report at least 75% improvement in overall symptoms and/or function to demonstrate improved functional mobility Baseline: 0% Target date: 11/19/2021 Goal status: INITIAL  2.  Patient will be able to sit for at least 30 minutes without having pain afterwards when transitioning from sit to stand Baseline: painful Target date: 11/19/2021 Goal status: INITIAL  3.  Patient will demonstrate at least 4/5 MM in bilateral LE to demonstrate improved strength Baseline: see above Target date: 11/19/2021 Goal status: INITIAL   PLAN: PT FREQUENCY: 2x/week  PT DURATION:  6 weeks  PLANNED INTERVENTIONS: Therapeutic exercises, Therapeutic activity, Neuromuscular re-education, Balance training, Gait training, Patient/Family education, Joint mobilization, Stair training, Aquatic Therapy, Dry Needling, Electrical stimulation, Cryotherapy, Moist heat, Ionotophoresis '4mg'$ /ml Dexamethasone, and Manual therapy  PLAN FOR NEXT SESSION: hip strength/mobility, lumbar strength/mobility, repeated press ups, manual to right hip/lumbar STM, assess shoulder when indicated by MD  12:13 PM, 10/19/21 Jerene Pitch, DPT Physical Therapy with [facility]

## 2021-10-21 ENCOUNTER — Telehealth: Payer: Self-pay | Admitting: Physical Therapy

## 2021-10-21 ENCOUNTER — Encounter: Payer: 59 | Admitting: Physical Therapy

## 2021-10-21 NOTE — Telephone Encounter (Signed)
Called pt and left VM about missed apts, about NS/cancellation policy and about upcoming apt.  3:47 PM, 10/21/21 Jerene Pitch, DPT Physical Therapy with [facility]

## 2021-10-27 ENCOUNTER — Encounter: Payer: 59 | Admitting: Physical Therapy

## 2021-10-29 ENCOUNTER — Encounter: Payer: 59 | Admitting: Physical Therapy

## 2021-11-03 ENCOUNTER — Encounter: Payer: 59 | Admitting: Physical Therapy

## 2021-11-05 ENCOUNTER — Encounter: Payer: 59 | Admitting: Physical Therapy

## 2021-11-06 NOTE — Progress Notes (Deleted)
   I, Wendy Poet, LAT, ATC, am serving as scribe for Dr. Lynne Leader.  Tracy Bradshaw is a 46 y.o. female who presents to [place] at [facility] today for f/u of R-sided LBP w/ radiating pain into R hip and R leg to her foot thought to be multifactorial in nature.  She was last seen by Dr. Georgina Snell on 09/28/21 and was referred to PT of which she completed 1 visit and no-showed her next appt.  She was also prescribed gabapentin.  Today, pt reports   Diagnostic imaging: R hip and L-spine XR- 09/28/21  Pertinent review of systems: ***  Relevant historical information: ***   Exam:  There were no vitals taken for this visit. General: Well Developed, well nourished, and in no acute distress.   MSK: ***    Lab and Radiology Results No results found for this or any previous visit (from the past 72 hour(s)). No results found.     Assessment and Plan: 46 y.o. female with ***   PDMP not reviewed this encounter. No orders of the defined types were placed in this encounter.  No orders of the defined types were placed in this encounter.    Discussed warning signs or symptoms. Please see discharge instructions. Patient expresses understanding.   ***

## 2021-11-09 ENCOUNTER — Ambulatory Visit: Payer: 59 | Admitting: Family Medicine

## 2021-11-10 ENCOUNTER — Encounter: Payer: 59 | Admitting: Physical Therapy

## 2022-02-22 ENCOUNTER — Encounter: Payer: Self-pay | Admitting: *Deleted

## 2022-03-01 LAB — HM MAMMOGRAPHY

## 2022-03-09 LAB — HM PAP SMEAR: HPV, high-risk: NEGATIVE

## 2022-03-17 ENCOUNTER — Encounter: Payer: Self-pay | Admitting: Physician Assistant

## 2022-03-24 ENCOUNTER — Encounter: Payer: Self-pay | Admitting: Physician Assistant

## 2022-03-24 ENCOUNTER — Ambulatory Visit (INDEPENDENT_AMBULATORY_CARE_PROVIDER_SITE_OTHER): Payer: 59 | Admitting: Physician Assistant

## 2022-03-24 ENCOUNTER — Ambulatory Visit (INDEPENDENT_AMBULATORY_CARE_PROVIDER_SITE_OTHER)
Admission: RE | Admit: 2022-03-24 | Discharge: 2022-03-24 | Disposition: A | Discharge status: Home / Self Care | Payer: 59 | Source: Ambulatory Visit | Attending: Physician Assistant | Admitting: Physician Assistant

## 2022-03-24 VITALS — BP 126/80 | HR 91 | Temp 98.0°F | Ht 61.0 in | Wt 131.0 lb

## 2022-03-24 DIAGNOSIS — M546 Pain in thoracic spine: Secondary | ICD-10-CM | POA: Diagnosis not present

## 2022-03-24 DIAGNOSIS — J029 Acute pharyngitis, unspecified: Secondary | ICD-10-CM | POA: Diagnosis not present

## 2022-03-24 LAB — POCT RAPID STREP A (OFFICE): Rapid Strep A Screen: POSITIVE — AB

## 2022-03-24 LAB — POC COVID19 BINAXNOW: SARS Coronavirus 2 Ag: NEGATIVE

## 2022-03-24 MED ORDER — AMOXICILLIN 500 MG PO CAPS: 500.0000 mg | ORAL_CAPSULE | Freq: Two times a day (BID) | ORAL | 0 refills | Status: AC

## 2022-03-24 NOTE — Patient Instructions (Addendum)
It was great to see you!  Start amoxicillin for strep throat Take ibuprofen for your back pain  If your symptoms worsen, please reach out  If back pain does not improve, please reach out  Take care,  Inda Coke PA-C

## 2022-03-24 NOTE — Progress Notes (Signed)
Tracy Bradshaw is a 46 y.o. female here for a new problem.  History of Present Illness:   Chief Complaint  Patient presents with   Sore Throat    Pt c/o sore throat started 2 days ago, chills, nasal congestion and cough,but no fever.   Back Pain    Pt said she woke up this morning with mid back pain when she takes a deep breath.    HPI  Sore throat Onset x3 days ago. Worsened sore throat today. Complains of mild productive cough, nasal congestion, and postnasal drainage. She took Theraflu but found no relief. No fevers. Children sick but tested negative for strep.  Back pain  She feels pain in right side of thoracic region with deep inspiration. Felt this previously a few years ago while lifting her son. Denies any recent straining or heavy lifting. Denies chest pain, dysuria, hematuria, or any recent travel. Denies any history of asthma. Has used inhalers in the past but not currently.    Past Medical History:  Diagnosis Date   Abnormal Pap smear    HPV   Acute blood loss anemia 04/30/2012   Blood type O+    PONV (postoperative nausea and vomiting)    SAB (spontaneous abortion)    Uterine fibroid 04/06/2011   Submucus      Social History   Tobacco Use   Smoking status: Never   Smokeless tobacco: Never  Substance Use Topics   Alcohol use: Yes    Comment: very rare   Drug use: No    Past Surgical History:  Procedure Laterality Date   CESAREAN SECTION  04/29/2012   Procedure: CESAREAN SECTION;  Surgeon: Elveria Royals, MD;  Location: [facility];  Service: Obstetrics;  Laterality: N/A;   CESAREAN SECTION N/A 06/27/2015   Procedure: Repeat CESAREAN SECTION;  Surgeon: Aloha Gell, MD;  Location: [facility];  Service: Obstetrics;  Laterality: N/A;  EDD: 06/30/15    Family History  Problem Relation Age of Onset   Hypertension Mother    Heart disease Father 85       Died age 66   Heart disease Brother 25       "Big Heart"    Other Neg Hx    Cancer Neg Hx     No  Known Allergies  Current Medications:   Current Outpatient Medications:    acetaminophen (TYLENOL) 325 MG tablet, Take 2 tablets (650 mg total) by mouth every 4 (four) hours as needed (for pain scale < 4)., Disp: , Rfl:    albuterol (PROVENTIL HFA;VENTOLIN HFA) 108 (90 Base) MCG/ACT inhaler, Inhale 1-2 puffs into the lungs as needed., Disp: , Rfl:    amoxicillin (AMOXIL) 500 MG capsule, Take 1 capsule (500 mg total) by mouth 2 (two) times daily for 10 days., Disp: 20 capsule, Rfl: 0   Ascorbic Acid (VITAMIN C GUMMIES PO), Take by mouth. Chew 3 gummies daily, Disp: , Rfl:    ibuprofen (ADVIL) 600 MG tablet, Take 600 mg by mouth as needed., Disp: , Rfl:    Multiple Vitamins-Minerals (MULTIVITAMIN WITH MINERALS) tablet, Take 1 tablet by mouth daily., Disp: , Rfl:    SALINE NASAL MIST NA, Place into the nose as needed., Disp: , Rfl:    Review of Systems:   Review of Systems  Constitutional:  Negative for chills, fever, malaise/fatigue and weight loss.  HENT:  Positive for congestion and sore throat. Negative for hearing loss and sinus pain.   Respiratory:  Positive for cough and  sputum production. Negative for hemoptysis.   Cardiovascular:  Negative for chest pain, palpitations, leg swelling and PND.  Gastrointestinal:  Negative for abdominal pain, constipation, diarrhea, heartburn, nausea and vomiting.  Genitourinary:  Negative for dysuria, flank pain, frequency, hematuria and urgency.  Musculoskeletal:  Positive for back pain. Negative for myalgias and neck pain.  Skin:  Negative for itching and rash.  Neurological:  Negative for dizziness, tingling, seizures and headaches.  Endo/Heme/Allergies:  Negative for polydipsia.  Psychiatric/Behavioral:  Negative for depression. The patient is not nervous/anxious.     Vitals:   Vitals:   03/24/22 1136  BP: 126/80  Pulse: 91  Temp: 98 F (36.7 C)  TempSrc: Temporal  SpO2: 99%  Weight: 131 lb (59.4 kg)  Height: '5\' 1"'$  (1.549 m)     Body  mass index is 24.75 kg/m.  Physical Exam:   Physical Exam Vitals and nursing note reviewed.  Constitutional:      General: She is not in acute distress.    Appearance: She is well-developed. She is not ill-appearing or toxic-appearing.  HENT:     Head: Normocephalic and atraumatic.     Right Ear: Tympanic membrane, ear canal and external ear normal. Tympanic membrane is not erythematous, retracted or bulging.     Left Ear: Tympanic membrane, ear canal and external ear normal. Tympanic membrane is not erythematous, retracted or bulging.     Nose: Nose normal.     Right Sinus: No maxillary sinus tenderness or frontal sinus tenderness.     Left Sinus: No maxillary sinus tenderness or frontal sinus tenderness.     Mouth/Throat:     Pharynx: Uvula midline. Posterior oropharyngeal erythema present.  Eyes:     General: Lids are normal.     Conjunctiva/sclera: Conjunctivae normal.  Neck:     Trachea: Trachea normal.  Cardiovascular:     Rate and Rhythm: Normal rate and regular rhythm.     Pulses: Normal pulses.     Heart sounds: Normal heart sounds, S1 normal and S2 normal.  Pulmonary:     Effort: Pulmonary effort is normal.     Breath sounds: Normal breath sounds. No decreased breath sounds, wheezing, rhonchi or rales.  Musculoskeletal:     Comments: Tenderness to R thoracic paraspinal area - no bony tenderness  Lymphadenopathy:     Cervical: No cervical adenopathy.  Skin:    General: Skin is warm and dry.  Neurological:     Mental Status: She is alert.     GCS: GCS eye subscore is 4. GCS verbal subscore is 5. GCS motor subscore is 6.  Psychiatric:        Speech: Speech normal.        Behavior: Behavior normal. Behavior is cooperative.     Assessment and Plan:   Sore throat No red flags on exam Start amoxicillin per orders for strep NSAIDs for pain If new/worsening sx, recommend close follow-up  Acute right-sided thoracic back pain Suspect muscle strain No obvious red  flags Start NSAIDs for a few days Follow-up if new/worsening sx She is requesting xray today, so this was ordered for her   I,Alexis Herring,acting as a scribe for [employer], PA.,have documented all relevant documentation on the behalf of Inda Coke, PA,as directed by  Inda Coke, PA while in the presence of Inda Coke, [place].  I, Inda Coke, [place], have reviewed all documentation for this visit. The documentation on 03/24/22 for the exam, diagnosis, procedures, and orders are all accurate and  complete.  Inda Coke PA-c

## 2022-04-19 ENCOUNTER — Ambulatory Visit: Payer: 59 | Admitting: Physician Assistant

## 2022-04-21 ENCOUNTER — Ambulatory Visit: Payer: 59 | Admitting: Family Medicine

## 2022-04-21 ENCOUNTER — Encounter: Payer: Self-pay | Admitting: Family Medicine

## 2022-04-21 VITALS — BP 122/82 | HR 89 | Temp 98.2°F | Ht 61.0 in | Wt 127.4 lb

## 2022-04-21 DIAGNOSIS — J101 Influenza due to other identified influenza virus with other respiratory manifestations: Secondary | ICD-10-CM | POA: Diagnosis not present

## 2022-04-21 DIAGNOSIS — R042 Hemoptysis: Secondary | ICD-10-CM

## 2022-04-21 DIAGNOSIS — H6690 Otitis media, unspecified, unspecified ear: Secondary | ICD-10-CM

## 2022-04-21 MED ORDER — AMOXICILLIN-POT CLAVULANATE 875-125 MG PO TABS: 1.0000 | ORAL_TABLET | Freq: Two times a day (BID) | ORAL | 0 refills | Status: DC

## 2022-04-21 MED ORDER — BENZONATATE 200 MG PO CAPS: 200.0000 mg | ORAL_CAPSULE | Freq: Two times a day (BID) | ORAL | 0 refills | Status: DC | PRN

## 2022-04-21 NOTE — Patient Instructions (Signed)
It was very nice to see you today!  You have an ear infection.  You may have a pneumonia as well.  Please start the Augmentin.  Take the Tessalon as needed for cough.  Make sure that you are getting plenty of fluids and staying well-hydrated.  Let us know if not improving by next week.  Take care, Dr Jerline Pain  PLEASE NOTE:  If you had any lab tests please let us know if you have not heard back within a few days. You may see your results on mychart before we have a chance to review them but we will give you a call once they are reviewed by [place]. If we ordered any referrals today, please let us know if you have not heard from their office within the next week.   Please try these tips to maintain a healthy lifestyle:  Eat at least 3 REAL meals and 1-2 snacks per day.  Aim for no more than 5 hours between eating.  If you eat breakfast, please do so within one hour of getting up.   Each meal should contain half fruits/vegetables, one quarter protein, and one quarter carbs (no bigger than a computer mouse)  Cut down on sweet beverages. This includes juice, soda, and sweet tea.   Drink at least 1 glass of water with each meal and aim for at least 8 glasses per day  Exercise at least 150 minutes every week.

## 2022-04-21 NOTE — Progress Notes (Signed)
Tracy Bradshaw is a 46 y.o. female who presents today for an office visit.  Assessment/Plan:  New/Acute Problems: Flu Patient has been seen twice for this once in urgent care and once in the ED over the last couple days.  She had an x-ray done a couple of days ago which was reportedly negative.  She was not started on Tamiflu and is now out of the treatment window.   Seems like most of her systemic symptoms are improving however appears to have secondary infection with a left otitis media and possibly underlying pneumonia.  Still has a lot of cough.  Her vital signs are stable today.  We will continue with supportive care including good hydration.  She can use over-the-counter meds as needed.  We will also be starting Tessalon as needed for cough.  She potentially could have an underlying secondary pneumonia that we will be treating as below.  We discussed reasons to return to care and seek emergent care.  Otitis Media Will be starting Augmentin 875 mg twice daily x7 days.  She can use over-the-counter meds as needed.  She is already on prednisone taper from the urgent care visit.  Discussed reasons to return to care.  She will let us know if not improving.  Hemoptysis Likely secondary to flu and frequent cough.  She does have quite a few rhonchi on exam today however her vital signs are stable and she has no signs of respiratory distress.  She is having blood mixed with sputum - no large hemoptysis.  She had a normal x-ray a couple of days ago.  It is possible that she could be developing an underlying secondary bacterial pneumonia as well.  We will be starting Augmentin as above which should treat this as well.  We will also be starting Tessalon as above.  Given the mild nature of her symptoms and that her vital signs are stable and she has no signs of respiratory distress do not think that she needs emergent work-up at this point.  We discussed reasons seek emergent care.  She will let us know if not  improving over the next several days or if symptoms worsen.      Subjective:  HPI:  Patient here with cough. Symptoms started about 5 days ago. Went to urgent care and had testing which was positive for flu. She was started started on prednisone and albuterol. She continued to have issues and went to the ED a couple of days later due to shortness of breath. She had xray of her chest which was negative. No fevers or chills the last couple of days. Still has a lot of cough. She has started to cough up blood as well since last night.  Has bright red blood mixed with sputum.  She has some chest pain with coughing.  No chest pain at rest.       Objective:  Physical Exam: BP 122/82 (BP Location: Left Arm, Patient Position: Sitting, Cuff Size: Normal)   Pulse 89   Temp 98.2 F (36.8 C) (Other (Comment))   Ht '5\' 1"'$  (1.549 m)   Wt 127 lb 6.4 oz (57.8 kg)   LMP 03/20/2022 (Exact Date)   SpO2 97%   BMI 24.07 kg/m   Gen: No acute distress, resting comfortably HEENT: Left TM bulging and erythematous with effusion.  Right TM with clear effusion. CV: Regular rate and rhythm with no murmurs appreciated Pulm: Normal work of breathing.  Speaking in full sentences.  Diffuse  rhonchi throughout all lung fields Neuro: Grossly normal, moves all extremities Psych: Normal affect and thought content  Time Spent: 30 minutes of total time was spent on the date of the encounter performing the following actions: chart review prior to seeing the patient including recent ED and urgent care visit, obtaining history, performing a medically necessary exam, counseling on the treatment plan, placing orders, and documenting in our EHR.        Algis Greenhouse. Jerline Pain, MD 04/21/2022 9:18 AM

## 2022-04-30 ENCOUNTER — Encounter: Payer: Self-pay | Admitting: Physician Assistant

## 2022-04-30 ENCOUNTER — Ambulatory Visit (INDEPENDENT_AMBULATORY_CARE_PROVIDER_SITE_OTHER)
Admission: RE | Admit: 2022-04-30 | Discharge: 2022-04-30 | Disposition: A | Discharge status: Home / Self Care | Payer: 59 | Source: Ambulatory Visit | Attending: Physician Assistant | Admitting: Physician Assistant

## 2022-04-30 ENCOUNTER — Ambulatory Visit: Payer: 59 | Admitting: Physician Assistant

## 2022-04-30 VITALS — BP 120/74 | HR 72 | Temp 97.8°F | Ht 61.0 in | Wt 128.2 lb

## 2022-04-30 DIAGNOSIS — R052 Subacute cough: Secondary | ICD-10-CM

## 2022-04-30 MED ORDER — AZITHROMYCIN 250 MG PO TABS: ORAL_TABLET | ORAL | 0 refills | Status: AC

## 2022-04-30 MED ORDER — BENZONATATE 100 MG PO CAPS: 100.0000 mg | ORAL_CAPSULE | Freq: Three times a day (TID) | ORAL | 0 refills | Status: DC | PRN

## 2022-04-30 NOTE — Progress Notes (Signed)
Tracy Bradshaw is a 46 y.o. female here for a follow up of a pre-existing problem.  History of Present Illness:   Chief Complaint  Patient presents with   Cough    Pt is still c/o cough, chest congestion expectorating green sputum, left ear pain     HPI  Cough Patient went to the ER on 11/20 for a cough, SOB and flu like symptoms and was diagnosed with Influenza B. A chest xray was also completed this visit with no abnormal findings. She was in office with Dr. Jerline Pain two days later for a cough, but with blood in her sputum and left ear pain. She had suspected PNA and AOM and was given 200 mg Tessalon perles for her cough and Augmentin 875 mg BID for her ear. She has completed the Augmentin. She tolerated Tessalon in the past but the 200 mg dose is a bit sedating for her. Patient is no longer coughing up blood in sputum. She confirms post nasal drip and eating/drinking regularly. Patient is also experiencing headaches.Denies exertional chest pain, SOB, n/v/d.   Past Medical History:  Diagnosis Date   Abnormal Pap smear    HPV   Acute blood loss anemia 04/30/2012   Blood type O+    PONV (postoperative nausea and vomiting)    SAB (spontaneous abortion)    Uterine fibroid 04/06/2011   Submucus      Social History   Tobacco Use   Smoking status: Never   Smokeless tobacco: Never  Substance Use Topics   Alcohol use: Yes    Comment: very rare   Drug use: No    Past Surgical History:  Procedure Laterality Date   CESAREAN SECTION  04/29/2012   Procedure: CESAREAN SECTION;  Surgeon: Elveria Royals, MD;  Location: [facility];  Service: Obstetrics;  Laterality: N/A;   CESAREAN SECTION N/A 06/27/2015   Procedure: Repeat CESAREAN SECTION;  Surgeon: Aloha Gell, MD;  Location: [facility];  Service: Obstetrics;  Laterality: N/A;  EDD: 06/30/15    Family History  Problem Relation Age of Onset   Hypertension Mother    Heart disease Father 18       Died age 61   Heart disease Brother 58        "Big Heart"    Other Neg Hx    Cancer Neg Hx     No Known Allergies  Current Medications:   Current Outpatient Medications:    acetaminophen (TYLENOL) 325 MG tablet, Take 2 tablets (650 mg total) by mouth every 4 (four) hours as needed (for pain scale < 4)., Disp: , Rfl:    albuterol (PROVENTIL) (2.5 MG/3ML) 0.083% nebulizer solution, Take 2.5 mg by nebulization every 6 (six) hours as needed., Disp: , Rfl:    Ascorbic Acid (VITAMIN C GUMMIES PO), Take by mouth. Chew 3 gummies daily, Disp: , Rfl:    azithromycin (ZITHROMAX) 250 MG tablet, Take 2 tablets on day 1, then 1 tablet daily on days 2 through 5, Disp: 6 tablet, Rfl: 0   benzonatate (TESSALON) 100 MG capsule, Take 1 capsule (100 mg total) by mouth 3 (three) times daily as needed for cough., Disp: 30 capsule, Rfl: 0   ibuprofen (ADVIL) 600 MG tablet, Take 600 mg by mouth as needed., Disp: , Rfl:    Multiple Vitamins-Minerals (MULTIVITAMIN WITH MINERALS) tablet, Take 1 tablet by mouth daily., Disp: , Rfl:    SALINE NASAL MIST NA, Place into the nose as needed., Disp: , Rfl:  Review of Systems:   Review of Systems  HENT:  Positive for ear pain (left).   Respiratory:  Positive for cough.   Neurological:  Positive for headaches.    Vitals:   Vitals:   04/30/22 1529  BP: 120/74  Pulse: 72  Temp: 97.8 F (36.6 C)  TempSrc: Temporal  SpO2: 97%  Weight: 128 lb 4 oz (58.2 kg)  Height: '5\' 1"'$  (1.549 m)     Body mass index is 24.23 kg/m.  Physical Exam:   Physical Exam Vitals and nursing note reviewed.  Constitutional:      General: She is not in acute distress.    Appearance: Normal appearance. She is well-developed. She is not ill-appearing or toxic-appearing.  HENT:     Head: Normocephalic and atraumatic.     Right Ear: External ear normal.     Left Ear: External ear normal.  Eyes:     Extraocular Movements: Extraocular movements intact.     Pupils: Pupils are equal, round, and reactive to light.   Cardiovascular:     Rate and Rhythm: Normal rate and regular rhythm.     Pulses: Normal pulses.     Heart sounds: Normal heart sounds, S1 normal and S2 normal. No murmur heard.    No gallop.  Pulmonary:     Effort: Pulmonary effort is normal. No respiratory distress.     Breath sounds: Rhonchi present. No wheezing or rales.  Skin:    General: Skin is warm and dry.  Neurological:     Mental Status: She is alert and oriented to person, place, and time.     GCS: GCS eye subscore is 4. GCS verbal subscore is 5. GCS motor subscore is 6.  Psychiatric:        Speech: Speech normal.        Behavior: Behavior normal. Behavior is cooperative.        Judgment: Judgment normal.     Assessment and Plan:   There are no diagnoses linked to this encounter.   I,Verona Buck,acting as a Education administrator for [employer], PA.,have documented all relevant documentation on the behalf of Inda Coke, PA,as directed by  Inda Coke, PA while in the presence of Inda Coke, [place].   Inda Coke, PA-C

## 2022-04-30 NOTE — Patient Instructions (Addendum)
It was great to see you!  -Start azithromycin (antibiotic that I am sending in) -Start allegra -Start tessalon perles 100 mg  An order for xray has been put in for you. To have this done, you can walk in at the [facility] location without a scheduled appointment.  The address is [address] It is across the street from [facility]. Lab and x-xray are located in the basement.   Hours of operation are M-F 8:30am to 5:00pm.  Please note that they are closed for lunch between 12:30 and 1:00pm.  If any new/worsening symptoms, please let us know.  Take care,  Inda Coke PA-C

## 2022-05-03 ENCOUNTER — Other Ambulatory Visit: Payer: Self-pay | Admitting: *Deleted

## 2022-05-03 DIAGNOSIS — J189 Pneumonia, unspecified organism: Secondary | ICD-10-CM

## 2022-06-03 ENCOUNTER — Ambulatory Visit (INDEPENDENT_AMBULATORY_CARE_PROVIDER_SITE_OTHER)
Admission: RE | Admit: 2022-06-03 | Discharge: 2022-06-03 | Disposition: A | Discharge status: Home / Self Care | Payer: 59 | Source: Ambulatory Visit | Attending: Physician Assistant | Admitting: Physician Assistant

## 2022-06-03 DIAGNOSIS — J189 Pneumonia, unspecified organism: Secondary | ICD-10-CM | POA: Diagnosis not present

## 2022-06-16 ENCOUNTER — Ambulatory Visit: Payer: 59 | Admitting: Physician Assistant

## 2022-06-16 ENCOUNTER — Encounter: Payer: Self-pay | Admitting: Physician Assistant

## 2022-06-16 VITALS — BP 120/70 | HR 87 | Temp 98.0°F | Ht 61.0 in | Wt 132.2 lb

## 2022-06-16 DIAGNOSIS — R42 Dizziness and giddiness: Secondary | ICD-10-CM

## 2022-06-16 DIAGNOSIS — R29898 Other symptoms and signs involving the musculoskeletal system: Secondary | ICD-10-CM | POA: Diagnosis not present

## 2022-06-16 LAB — IBC + FERRITIN
Ferritin: 13.2 ng/mL (ref 10.0–291.0)
Iron: 103 ug/dL (ref 42–145)
Saturation Ratios: 29.3 % (ref 20.0–50.0)
TIBC: 351.4 ug/dL (ref 250.0–450.0)
Transferrin: 251 mg/dL (ref 212.0–360.0)

## 2022-06-16 LAB — CBC WITH DIFFERENTIAL/PLATELET
Basophils Absolute: 0 10*3/uL (ref 0.0–0.1)
Basophils Relative: 0.5 % (ref 0.0–3.0)
Eosinophils Absolute: 0.1 10*3/uL (ref 0.0–0.7)
Eosinophils Relative: 2.1 % (ref 0.0–5.0)
HCT: 36.9 % (ref 36.0–46.0)
Hemoglobin: 12.4 g/dL (ref 12.0–15.0)
Lymphocytes Relative: 27.4 % (ref 12.0–46.0)
Lymphs Abs: 2 10*3/uL (ref 0.7–4.0)
MCHC: 33.5 g/dL (ref 30.0–36.0)
MCV: 91.2 fl (ref 78.0–100.0)
Monocytes Absolute: 0.5 10*3/uL (ref 0.1–1.0)
Monocytes Relative: 7 % (ref 3.0–12.0)
Neutro Abs: 4.5 10*3/uL (ref 1.4–7.7)
Neutrophils Relative %: 63 % (ref 43.0–77.0)
Platelets: 290 10*3/uL (ref 150.0–400.0)
RBC: 4.04 Mil/uL (ref 3.87–5.11)
RDW: 13.9 % (ref 11.5–15.5)
WBC: 7.1 10*3/uL (ref 4.0–10.5)

## 2022-06-16 LAB — COMPREHENSIVE METABOLIC PANEL
ALT: 11 U/L (ref 0–35)
AST: 13 U/L (ref 0–37)
Albumin: 4.2 g/dL (ref 3.5–5.2)
Alkaline Phosphatase: 76 U/L (ref 39–117)
BUN: 12 mg/dL (ref 6–23)
CO2: 28 mEq/L (ref 19–32)
Calcium: 8.6 mg/dL (ref 8.4–10.5)
Chloride: 106 mEq/L (ref 96–112)
Creatinine, Ser: 0.63 mg/dL (ref 0.40–1.20)
GFR: 105.41 mL/min (ref >60.00)
Glucose, Bld: 89 mg/dL (ref 70–99)
Potassium: 3.6 mEq/L (ref 3.5–5.1)
Sodium: 139 mEq/L (ref 135–145)
Total Bilirubin: 0.3 mg/dL (ref 0.2–1.2)
Total Protein: 7.1 g/dL (ref 6.0–8.3)

## 2022-06-16 LAB — POCT URINE PREGNANCY: Preg Test, Ur: NEGATIVE

## 2022-06-16 LAB — VITAMIN D 25 HYDROXY (VIT D DEFICIENCY, FRACTURES): VITD: 17.49 ng/mL — ABNORMAL LOW (ref 30.00–100.00)

## 2022-06-16 LAB — VITAMIN B12: Vitamin B-12: >1500 pg/mL — ABNORMAL HIGH (ref 211–911)

## 2022-06-16 LAB — CK: Total CK: 51 U/L (ref 7–177)

## 2022-06-16 LAB — TSH: TSH: 2.07 u[IU]/mL (ref 0.35–5.50)

## 2022-06-16 NOTE — Patient Instructions (Signed)
It was great to see you!  Please work on getting at least 64 oz of fluid in daily. Please get an updated eye exam  I am going to get blood work and urine studies to make sure there is nothing going on.  Continue to work on regular nutrition and hydration. If symptoms do not improve, call me and we will figure out next steps.  If symptoms worsen, please go to urgent care or ER.  Take care,  Inda Coke PA-C

## 2022-06-16 NOTE — Progress Notes (Signed)
Tracy Bradshaw is a 47 y.o. female here for a new problem.  History of Present Illness:   Chief Complaint  Patient presents with   Dizziness    Pt c/o dizziness off and on x 2 weeks, c/o frontal headache above eyes yesterday and today.    Extremity Weakness    Pt c/o feeling weakness off and on in arms and legs.    HPI  Lightheadedness Feeling lightheaded for past 2 weeks intermittently Episodes occur 1-2 times daily lasting for ~1 minute.  Describes feeling light-headed after looking up, turning quickly when walking or with standing. Had an episode of vertigo yesterday only, but otherwise only lightheadedness  Was experiencing some nausea yesterday.  Notes accompanying mild frontal headaches since yesterday and fatigue.  Eating well but not drinking adequately. Has started drinking more water since yesterday. Denies recent travel, sinus congestion. Denies changing her activity level. Denies caffeine and new supplement use.  Has an appointment with her optometrist later this year. Denies any new vision changes but needs follow up. Denies any double vision,  She does report that several years ago she hit her head very hard on her sink corner.  She never went to a provider to have this evaluated after it happened.  When it happened she felt like there was blood pooling on the side of her face but there was actually no blood there.  She states that she always has this in the back of her mind and worries that there could be something wrong with her brain.   Weakness in arms/legs.  Reports that blood is "running slowly." Has similar feeling in arms intermittently. Denies true weakness or inability to walk or stand  Reports pain in bones, especially in her right hip and knees. Visited chiropractor with some relief but pain has since returned.   Reports having normal menstrual cycles, most recently over the last two days.  She does have a history of anemia and wants to make sure that she  has not experienced this today.    Past Medical History:  Diagnosis Date   Abnormal Pap smear    HPV   Acute blood loss anemia 04/30/2012   Blood type O+    PONV (postoperative nausea and vomiting)    SAB (spontaneous abortion)    Uterine fibroid 04/06/2011   Submucus      Social History   Tobacco Use   Smoking status: Never   Smokeless tobacco: Never  Substance Use Topics   Alcohol use: Yes    Comment: very rare   Drug use: No    Past Surgical History:  Procedure Laterality Date   CESAREAN SECTION  04/29/2012   Procedure: CESAREAN SECTION;  Surgeon: Elveria Royals, MD;  Location: [facility];  Service: Obstetrics;  Laterality: N/A;   CESAREAN SECTION N/A 06/27/2015   Procedure: Repeat CESAREAN SECTION;  Surgeon: Aloha Gell, MD;  Location: [facility];  Service: Obstetrics;  Laterality: N/A;  EDD: 06/30/15    Family History  Problem Relation Age of Onset   Hypertension Mother    Heart disease Father 65       Died age 72   Heart disease Brother 29       "Big Heart"    Other Neg Hx    Cancer Neg Hx     No Known Allergies  Current Medications:   Current Outpatient Medications:    acetaminophen (TYLENOL) 325 MG tablet, Take 2 tablets (650 mg total) by mouth every 4 (four)  hours as needed (for pain scale < 4)., Disp: , Rfl:    albuterol (PROVENTIL) (2.5 MG/3ML) 0.083% nebulizer solution, Take 2.5 mg by nebulization every 6 (six) hours as needed., Disp: , Rfl:    Ascorbic Acid (VITAMIN C GUMMIES PO), Take by mouth. Chew 3 gummies daily, Disp: , Rfl:    ibuprofen (ADVIL) 600 MG tablet, Take 600 mg by mouth as needed., Disp: , Rfl:    Multiple Vitamins-Minerals (MULTIVITAMIN WITH MINERALS) tablet, Take 1 tablet by mouth daily., Disp: , Rfl:    SALINE NASAL MIST NA, Place into the nose as needed., Disp: , Rfl:    Review of Systems:   Review of Systems  Constitutional:  Positive for malaise/fatigue.  Eyes:  Negative for double vision.  Gastrointestinal:  Positive for  nausea.  Musculoskeletal:  Positive for joint pain (Right hip and bilateral knees).          Neurological:  Positive for dizziness (vertigo yesterday, resolved), weakness (BLE) and headaches.       + lightheadedness     Vitals:   Vitals:   06/16/22 1326  BP: 120/70  Pulse: 87  Temp: 98 F (36.7 C)  TempSrc: Temporal  SpO2: 99%  Weight: 132 lb 4 oz (60 kg)  Height: '5\' 1"'$  (1.549 m)     Body mass index is 24.99 kg/m.  Orthostatic VS for the past 24 hrs (Last 3 readings):  BP- Lying Pulse- Lying BP- Sitting Pulse- Sitting BP- Standing at 0 minutes Pulse- Standing at 0 minutes  06/16/22 1412 120/80 83 120/80 85 120/88 84    Physical Exam:   Physical Exam Vitals and nursing note reviewed.  Constitutional:      General: She is not in acute distress.    Appearance: She is well-developed. She is not ill-appearing or toxic-appearing.  HENT:     Head: Normocephalic and atraumatic.     Right Ear: Tympanic membrane, ear canal and external ear normal. Tympanic membrane is not erythematous, retracted or bulging.     Left Ear: Tympanic membrane, ear canal and external ear normal. Tympanic membrane is not erythematous, retracted or bulging.     Nose: Nose normal.     Right Sinus: No maxillary sinus tenderness or frontal sinus tenderness.     Left Sinus: No maxillary sinus tenderness or frontal sinus tenderness.     Mouth/Throat:     Pharynx: Uvula midline. No posterior oropharyngeal erythema.  Eyes:     General: Lids are normal.     Conjunctiva/sclera: Conjunctivae normal.  Neck:     Trachea: Trachea normal.  Cardiovascular:     Rate and Rhythm: Normal rate and regular rhythm.     Pulses: Normal pulses.     Heart sounds: Normal heart sounds, S1 normal and S2 normal.  Pulmonary:     Effort: Pulmonary effort is normal.     Breath sounds: Normal breath sounds. No decreased breath sounds, wheezing, rhonchi or rales.  Musculoskeletal:     Comments: No tenderness to legs or obvious  decreased ROM  Lymphadenopathy:     Cervical: No cervical adenopathy.  Skin:    General: Skin is warm and dry.  Neurological:     Mental Status: She is alert.     GCS: GCS eye subscore is 4. GCS verbal subscore is 5. GCS motor subscore is 6.     Cranial Nerves: Cranial nerves 2-12 are intact.     Sensory: Sensation is intact.     Motor: Motor function  is intact.     Coordination: Coordination is intact.     Comments: UE and LE strength 5/5 b/l  Psychiatric:        Speech: Speech normal.        Behavior: Behavior normal. Behavior is cooperative.     Assessment and Plan:   Intermittent lightheadedness Unclear etiology Orthostatics are negative Recommend continued efforts at better hydration and regular nutrition Update blood work today to rule out organic cause of symptoms If symptoms persist, possibly get imaging of brain  Leg weakness, bilateral No obvious weakness on my exam We will update blood work to assess for possible organic causes Continue to monitor and consider return to sports medicine for further evaluation; may also consider autoimmune Neurological exam is normal   I,Alexis Herring,acting as a scribe for [employer], PA.,have documented all relevant documentation on the behalf of Inda Coke, PA,as directed by  Inda Coke, PA while in the presence of Inda Coke, [place].  I, Inda Coke, [place], have reviewed all documentation for this visit. The documentation on 06/16/22 for the exam, diagnosis, procedures, and orders are all accurate and complete.   Inda Coke, PA-C

## 2022-06-17 ENCOUNTER — Other Ambulatory Visit: Payer: Self-pay | Admitting: Physician Assistant

## 2022-06-17 MED ORDER — VITAMIN D (ERGOCALCIFEROL) 1.25 MG (50000 UNIT) PO CAPS: 50000.0000 [IU] | ORAL_CAPSULE | ORAL | 0 refills | Status: DC

## 2022-06-22 ENCOUNTER — Other Ambulatory Visit: Payer: Self-pay | Admitting: *Deleted

## 2022-06-22 DIAGNOSIS — R748 Abnormal levels of other serum enzymes: Secondary | ICD-10-CM

## 2022-06-28 ENCOUNTER — Telehealth: Payer: Self-pay | Admitting: Physician Assistant

## 2022-06-28 NOTE — Telephone Encounter (Signed)
Patient was sent to oncology for elevated b12-   Referral states : "Dear Tracy Bradshaw,   Thank you for referring Tracy Bradshaw to hematology/oncology.     The reason for this communication is to share we have received your patient's referral and have closed the referral because  elevated B12 level .     If you have questions about this referral or we can be of further assistance, please contact our office at [phone] and ask to speak with the new patient coordinator." As of 1/24- patient would like to know where she needs to go and what tests is needed.

## 2022-06-29 ENCOUNTER — Telehealth: Payer: Self-pay | Admitting: Physician Assistant

## 2022-06-29 NOTE — Telephone Encounter (Signed)
Spoke to pt told her per [facility] regarding referral, I am still looking into this for patient and have reviewed her case with a few specialists -- I will be in touch when I hear back.  Just to confirm, she does not take any vitamins/minerals or drink any nutrition/energy drinks? Pt said she is not. Asked her if taking Vit C? Pt said no, she has stopped for now only taking Vit D that was prescribed. Told her okay we will be in touch. Pt verbalized understanding.

## 2022-06-29 NOTE — Telephone Encounter (Signed)
Please see message and advise 

## 2022-06-29 NOTE — Telephone Encounter (Signed)
Please call patient -- as mentioned in prior messages, I have reviewed her elevated B12 case with a few colleagues.  Per their recommendations, because all of the other blood work that we did was completely normal, we do not need to pursue any additional testing at this time due.  I would like to recheck this in 3 months, sooner if any concerns

## 2022-07-01 NOTE — Telephone Encounter (Signed)
Spoke to pt told her per [facility],  I have reviewed her elevated B12 case with a few colleagues.   Per their recommendations, because all of the other blood work that we did was completely normal, we do not need to pursue any additional testing at this time due.   I would like to recheck this in 3 months, sooner if any concerns. Pt verbalized understanding. Appt scheduled for 09/27/2022 for physical and labs.

## 2022-07-06 ENCOUNTER — Ambulatory Visit: Payer: 59 | Admitting: Family Medicine

## 2022-07-06 ENCOUNTER — Ambulatory Visit: Payer: 59 | Admitting: Physician Assistant

## 2022-07-06 ENCOUNTER — Encounter: Payer: Self-pay | Admitting: Family Medicine

## 2022-07-06 VITALS — BP 110/70 | HR 83 | Temp 98.6°F | Ht 61.0 in | Wt 132.0 lb

## 2022-07-06 DIAGNOSIS — J029 Acute pharyngitis, unspecified: Secondary | ICD-10-CM | POA: Diagnosis not present

## 2022-07-06 LAB — POC COVID19 BINAXNOW: SARS Coronavirus 2 Ag: NEGATIVE

## 2022-07-06 LAB — POCT RAPID STREP A (OFFICE): Rapid Strep A Screen: NEGATIVE

## 2022-07-06 NOTE — Progress Notes (Signed)
Subjective  CC:  Chief Complaint  Patient presents with   Sore Throat    Sore throat x 3days with a cough   Same day acute visit; PCP not available. New pt to me. Chart reviewed.   HPI: Tracy Bradshaw is a 47 y.o. female who presents to the office today to address the problems listed above in the chief complaint. C/o sore throat, mild URI sxs without fever. No SOB or GI sxs. No known exposure ot strep or mono. OTC analgesics have been used with minimal or mild relief. Eating and drinking OK.  Sxs started 2-3 days ago and are improving. Less pain today. Mild cough at night. Works at school. Healthy I reviewed the patients updated PMH, FH, and SocHx.    Patient Active Problem List   Diagnosis Date Noted   Tachycardia 05/17/2019   Nonspecific abnormal electrocardiogram (ECG) (EKG) 05/17/2019   Current Meds  Medication Sig   acetaminophen (TYLENOL) 325 MG tablet Take 2 tablets (650 mg total) by mouth every 4 (four) hours as needed (for pain scale < 4).   albuterol (PROVENTIL) (2.5 MG/3ML) 0.083% nebulizer solution Take 2.5 mg by nebulization every 6 (six) hours as needed.   ibuprofen (ADVIL) 600 MG tablet Take 600 mg by mouth as needed.   Multiple Vitamins-Minerals (MULTIVITAMIN WITH MINERALS) tablet Take 1 tablet by mouth daily.   SALINE NASAL MIST NA Place into the nose as needed.   Vitamin D, Ergocalciferol, (DRISDOL) 1.25 MG (50000 UNIT) CAPS capsule Take 1 capsule (50,000 Units total) by mouth every 7 (seven) days.    Allergies: Patient has No Known Allergies.  Review of Systems: Constitutional: Negative for fever malaise or anorexia Cardiovascular: negative for chest pain Respiratory: negative for SOB or persistent cough Gastrointestinal: negative for abdominal pain  Objective  Vitals: BP 110/70   Pulse 83   Temp 98.6 F (37 C)   Ht '5\' 1"'$  (1.549 m)   Wt 132 lb (59.9 kg)   LMP 05/18/2022 (Exact Date)   SpO2 98%   BMI 24.94 kg/m  General: no acute distress ,  A&Ox3 HEENT: PEERL, conjunctiva normal, bilateral EAC and TMs are normal. Nares normal. Oropharynx moist with erythematous posterior pharynx without exudate, no cervical LAD, 2+ tonsils, midline uvula, neck is supple Cardiovascular:  RRR without murmur or gallop.  Respiratory:  Good breath sounds bilaterally, CTAB with normal respiratory effort Skin:  Warm, no rashes  Office Visit on 07/06/2022  Component Date Value Ref Range Status   SARS Coronavirus 2 Ag 07/06/2022 Negative  Negative Final   Rapid Strep A Screen 07/06/2022 Negative  Negative Final    Assessment  1. Acute viral pharyngitis   2. Sore throat      Plan  Supportive care with advil, tylenol, gargles etc discussed. . RTO if sxs persist or worsen.   Follow up: prn   Commons side effects, risks, benefits, and alternatives for medications and treatment plan prescribed today were discussed, and the patient expressed understanding of the given instructions. Patient is instructed to call or message via MyChart if he/she has any questions or concerns regarding our treatment plan. No barriers to understanding were identified. We discussed Red Flag symptoms and signs in detail. Patient expressed understanding regarding what to do in case of urgent or emergency type symptoms.  Medication list was reconciled, printed and provided to the patient in AVS. Patient instructions and summary information was reviewed with the patient as documented in the AVS. This note was prepared with  assistance of Systems analyst. Occasional wrong-word or sound-a-like substitutions may have occurred due to the inherent limitations of voice recognition software  Orders Placed This Encounter  Procedures   POC COVID-19   POCT rapid strep A   No orders of the defined types were placed in this encounter.

## 2022-07-06 NOTE — Patient Instructions (Signed)
Please follow up if symptoms do not improve or as needed.    Pharyngitis  Pharyngitis is inflammation of the throat (pharynx). It is a very common cause of sore throat. Pharyngitis can be caused by a bacteria, but it is usually caused by a virus. Most cases of pharyngitis get better on their own without treatment. What are the causes? This condition may be caused by: Infection by viruses (viral). Viral pharyngitis spreads easily from person to person (is contagious) through coughing, sneezing, and sharing of personal items or utensils such as cups, forks, spoons, and toothbrushes. Infection by bacteria (bacterial). Bacterial pharyngitis may be spread by touching the nose or face after coming in contact with the bacteria, or through close contact, such as kissing. Allergies. Allergies can cause buildup of mucus in the throat (post-nasal drip), leading to inflammation and irritation. Allergies can also cause blocked nasal passages, forcing breathing through the mouth, which dries and irritates the throat. What increases the risk? You are more likely to develop this condition if: You are 5-24 years old. You are exposed to crowded environments such as daycare, school, or dormitory living. You live in a cold climate. You have a weakened disease-fighting (immune) system. What are the signs or symptoms? Symptoms of this condition vary by the cause. Common symptoms of this condition include: Sore throat. Fatigue. Low-grade fever. Stuffy nose (nasal congestion) and cough. Headache. Other symptoms may include: Glands in the neck (lymph nodes) that are swollen. Skin rashes. Plaque-like film on the throat or tonsils. This is often a symptom of bacterial pharyngitis. Vomiting. Red, itchy eyes (conjunctivitis). Loss of appetite. Joint pain and muscle aches. Enlarged tonsils. How is this diagnosed? This condition may be diagnosed based on your medical history and a physical exam. Your health care  provider will ask you questions about your illness and your symptoms. A swab of your throat may be done to check for bacteria (rapid strep test). Other lab tests may also be done, depending on the suspected cause, but these are rare. How is this treated? Many times, treatment is not needed for this condition. Pharyngitis usually gets better in 3-4 days without treatment. Bacterial pharyngitis may be treated with antibiotic medicines. Follow these instructions at home: Medicines Take over-the-counter and prescription medicines only as told by your health care provider. If you were prescribed an antibiotic medicine, take it as told by your health care provider. Do not stop taking the antibiotic even if you start to feel better. Use throat sprays to soothe your throat as told by your health care provider. Children can get pharyngitis. Do not give your child aspirin because of the association with Reye's syndrome. Managing pain To help with pain, try: Sipping warm liquids, such as broth, herbal tea, or warm water. Eating or drinking cold or frozen liquids, such as frozen ice pops. Gargling with a mixture of salt and water 3-4 times a day or as needed. To make salt water, completely dissolve -1 tsp (3-6 g) of salt in 1 cup (237 mL) of warm water. Sucking on hard candy or throat lozenges. Putting a cool-mist humidifier in your bedroom at night to moisten the air. Sitting in the bathroom with the door closed for 5-10 minutes while you run hot water in the shower.  General instructions  Do not use any products that contain nicotine or tobacco. These products include cigarettes, chewing tobacco, and vaping devices, such as e-cigarettes. If you need help quitting, ask your health care provider. Rest as told   by your health care provider. Drink enough fluid to keep your urine pale yellow. How is this prevented? To help prevent becoming infected or spreading infection: Wash your hands often with soap  and water for at least 20 seconds. If soap and water are not available, use hand sanitizer. Do not touch your eyes, nose, or mouth with unwashed hands, and wash hands after touching these areas. Do not share cups or eating utensils. Avoid close contact with people who are sick. Contact a health care provider if: You have large, tender lumps in your neck. You have a rash. You cough up green, yellow-brown, or bloody mucus. Get help right away if: Your neck becomes stiff. You drool or are unable to swallow liquids. You cannot drink or take medicines without vomiting. You have severe pain that does not go away, even after you take medicine. You have trouble breathing, and it is not caused by a stuffy nose. You have new pain and swelling in your joints such as the knees, ankles, wrists, or elbows. These symptoms may represent a serious problem that is an emergency. Do not wait to see if the symptoms will go away. Get medical help right away. Call your local emergency services (911 in the [place] Do not drive yourself to the hospital. Summary Pharyngitis is redness, pain, and swelling (inflammation) of the throat (pharynx). While pharyngitis can be caused by a bacteria, the most common causes are viral. Most cases of pharyngitis get better on their own without treatment. Bacterial pharyngitis is treated with antibiotic medicines. This information is not intended to replace advice given to you by your health care provider. Make sure you discuss any questions you have with your health care provider. Document Revised: 08/13/2020 Document Reviewed: 08/13/2020 Elsevier Patient Education  2023 [employer] Inc[employer]  

## 2022-09-27 ENCOUNTER — Encounter: Payer: Self-pay | Admitting: Physician Assistant

## 2022-09-27 ENCOUNTER — Ambulatory Visit (INDEPENDENT_AMBULATORY_CARE_PROVIDER_SITE_OTHER): Payer: 59 | Admitting: Physician Assistant

## 2022-09-27 VITALS — BP 130/86 | HR 73 | Temp 98.0°F | Ht 61.0 in | Wt 132.0 lb

## 2022-09-27 DIAGNOSIS — Z1211 Encounter for screening for malignant neoplasm of colon: Secondary | ICD-10-CM

## 2022-09-27 DIAGNOSIS — Z13220 Encounter for screening for lipoid disorders: Secondary | ICD-10-CM

## 2022-09-27 DIAGNOSIS — J029 Acute pharyngitis, unspecified: Secondary | ICD-10-CM | POA: Diagnosis not present

## 2022-09-27 DIAGNOSIS — E559 Vitamin D deficiency, unspecified: Secondary | ICD-10-CM

## 2022-09-27 DIAGNOSIS — Z1159 Encounter for screening for other viral diseases: Secondary | ICD-10-CM

## 2022-09-27 DIAGNOSIS — Z0000 Encounter for general adult medical examination without abnormal findings: Secondary | ICD-10-CM | POA: Diagnosis not present

## 2022-09-27 DIAGNOSIS — Z136 Encounter for screening for cardiovascular disorders: Secondary | ICD-10-CM | POA: Diagnosis not present

## 2022-09-27 DIAGNOSIS — M6281 Muscle weakness (generalized): Secondary | ICD-10-CM

## 2022-09-27 LAB — COMPREHENSIVE METABOLIC PANEL
ALT: 19 U/L (ref 0–35)
AST: 16 U/L (ref 0–37)
Albumin: 4 g/dL (ref 3.5–5.2)
Alkaline Phosphatase: 68 U/L (ref 39–117)
BUN: 11 mg/dL (ref 6–23)
CO2: 24 mEq/L (ref 19–32)
Calcium: 8.7 mg/dL (ref 8.4–10.5)
Chloride: 109 mEq/L (ref 96–112)
Creatinine, Ser: 0.63 mg/dL (ref 0.40–1.20)
GFR: 105.21 mL/min (ref >60.00)
Glucose, Bld: 88 mg/dL (ref 70–99)
Potassium: 3.9 mEq/L (ref 3.5–5.1)
Sodium: 139 mEq/L (ref 135–145)
Total Bilirubin: 0.4 mg/dL (ref 0.2–1.2)
Total Protein: 6.7 g/dL (ref 6.0–8.3)

## 2022-09-27 LAB — CBC WITH DIFFERENTIAL/PLATELET
Basophils Absolute: 0 10*3/uL (ref 0.0–0.1)
Basophils Relative: 0.4 % (ref 0.0–3.0)
Eosinophils Absolute: 0.2 10*3/uL (ref 0.0–0.7)
Eosinophils Relative: 2.7 % (ref 0.0–5.0)
HCT: 36.6 % (ref 36.0–46.0)
Hemoglobin: 12.3 g/dL (ref 12.0–15.0)
Lymphocytes Relative: 34.3 % (ref 12.0–46.0)
Lymphs Abs: 1.9 10*3/uL (ref 0.7–4.0)
MCHC: 33.8 g/dL (ref 30.0–36.0)
MCV: 90.4 fl (ref 78.0–100.0)
Monocytes Absolute: 0.4 10*3/uL (ref 0.1–1.0)
Monocytes Relative: 6.7 % (ref 3.0–12.0)
Neutro Abs: 3.2 10*3/uL (ref 1.4–7.7)
Neutrophils Relative %: 55.9 % (ref 43.0–77.0)
Platelets: 239 10*3/uL (ref 150.0–400.0)
RBC: 4.04 Mil/uL (ref 3.87–5.11)
RDW: 13.6 % (ref 11.5–15.5)
WBC: 5.7 10*3/uL (ref 4.0–10.5)

## 2022-09-27 LAB — LIPID PANEL
Cholesterol: 134 mg/dL (ref 0–200)
HDL: 44.6 mg/dL (ref >39.00)
LDL Cholesterol: 78 mg/dL (ref 0–99)
NonHDL: 89.04
Total CHOL/HDL Ratio: 3
Triglycerides: 53 mg/dL (ref 0.0–149.0)
VLDL: 10.6 mg/dL (ref 0.0–40.0)

## 2022-09-27 LAB — VITAMIN D 25 HYDROXY (VIT D DEFICIENCY, FRACTURES): VITD: 26.24 ng/mL — ABNORMAL LOW (ref 30.00–100.00)

## 2022-09-27 LAB — POCT RAPID STREP A (OFFICE): Rapid Strep A Screen: NEGATIVE

## 2022-09-27 NOTE — Patient Instructions (Addendum)
It was great to see you!  I have placed referral for physical therapy -- you can schedule this with out front desk staff.  Please let me know if your sore throat does not improve or if it worsens.  Please go to the lab for blood work.   Our office will call you with your results unless you have chosen to receive results via MyChart.  If your blood work is normal we will follow-up each year for physicals and as scheduled for chronic medical problems.  If anything is abnormal we will treat accordingly and get you in for a follow-up.  Take care,  Lelon Mast

## 2022-09-27 NOTE — Progress Notes (Signed)
Subjective:    Tracy Bradshaw is a 47 y.o. female and is here for a comprehensive physical exam.  Sore Throat  Pertinent negatives include no abdominal pain, coughing, diarrhea, headaches, neck pain or vomiting.    Health Maintenance Due  Topic Date Due   Hepatitis C Screening  Never done   PAP SMEAR-Modifier  02/26/2012   COLONOSCOPY (Pts 45-63yrs Insurance coverage will need to be confirmed)  Never done    Acute Concerns: Sore throat Has been going on for 2 days Kids are sick at home with sore throat No runny nose, ear pain Medications tried: none  Chronic Issues: Muscle weakness Having ongoing muscle weakness in R low back/groin Saw Dr. Denyse Amass in May 2023 and did one visit with PT Still having this issue -- denies worsening, denies saddle anesthesia, incontinence Was sent in gabapentin and she did not take this  Health Maintenance: Immunizations -- UTD Colonoscopy -- overdue, agreeable to referral - preferring cologuard Mammogram -- UTD PAP -- UTD -- requesting records Bone Density -- n/a Diet -- overall well balanced Exercise -- walks regularly and goes to the gym  Sleep habits -- no major concerns Mood -- overall stable  UTD with dentist? - yes UTD with eye doctor? - yes  Weight history: Wt Readings from Last 10 Encounters:  09/27/22 132 lb (59.9 kg)  07/06/22 132 lb (59.9 kg)  06/16/22 132 lb 4 oz (60 kg)  04/30/22 128 lb 4 oz (58.2 kg)  04/21/22 127 lb 6.4 oz (57.8 kg)  03/24/22 131 lb (59.4 kg)  09/28/21 131 lb 9.6 oz (59.7 kg)  09/25/21 131 lb (59.4 kg)  06/19/20 133 lb (60.3 kg)  12/25/19 131 lb 6.1 oz (59.6 kg)   Body mass index is 24.94 kg/m. Patient's last menstrual period was 09/12/2022 (exact date).  Alcohol use:  reports current alcohol use.  Tobacco use:  Tobacco Use: Low Risk  (09/27/2022)   Patient History    Smoking Tobacco Use: Never    Smokeless Tobacco Use: Never    Passive Exposure: Not on file   Eligible for lung cancer  screening? no     09/27/2022    8:54 AM  Depression screen PHQ 2/9  Decreased Interest 0  Down, Depressed, Hopeless 0  PHQ - 2 Score 0     Other providers/specialists: Patient Care Team: Jarold Motto, [place] as PCP - General (Physician Assistant) Obgyn, Ma Hillock as Consulting Physician    PMHx, SurgHx, SocialHx, Medications, and Allergies were reviewed in the Visit Navigator and updated as appropriate.   Past Medical History:  Diagnosis Date   Abnormal Pap smear    HPV   Acute blood loss anemia 04/30/2012   Blood type O+    PONV (postoperative nausea and vomiting)    SAB (spontaneous abortion)    Uterine fibroid 04/06/2011   Submucus      Past Surgical History:  Procedure Laterality Date   CESAREAN SECTION  04/29/2012   Procedure: CESAREAN SECTION;  Surgeon: Robley Fries, MD;  Location: [facility];  Service: Obstetrics;  Laterality: N/A;   CESAREAN SECTION N/A 06/27/2015   Procedure: Repeat CESAREAN SECTION;  Surgeon: Noland Fordyce, MD;  Location: [facility];  Service: Obstetrics;  Laterality: N/A;  EDD: 06/30/15     Family History  Problem Relation Age of Onset   Hypertension Mother    Heart disease Father 28       Died age 47   Heart disease Brother 29       "  Big Heart"    Other Neg Hx    Cancer Neg Hx     Social History   Tobacco Use   Smoking status: Never   Smokeless tobacco: Never  Substance Use Topics   Alcohol use: Yes    Comment: very rare   Drug use: No    Review of Systems:   Review of Systems  Constitutional:  Negative for chills, fever, malaise/fatigue and weight loss.  HENT:  Negative for hearing loss, sinus pain and sore throat.   Respiratory:  Negative for cough and hemoptysis.   Cardiovascular:  Negative for chest pain, palpitations, leg swelling and PND.  Gastrointestinal:  Negative for abdominal pain, constipation, diarrhea, heartburn, nausea and vomiting.  Genitourinary:  Negative for dysuria, frequency and urgency.  Musculoskeletal:   Negative for back pain, myalgias and neck pain.  Skin:  Negative for itching and rash.  Neurological:  Negative for dizziness, tingling, seizures and headaches.  Endo/Heme/Allergies:  Negative for polydipsia.  Psychiatric/Behavioral:  Negative for depression. The patient is not nervous/anxious.     Objective:   BP 130/86 (BP Location: Left Arm, Patient Position: Sitting, Cuff Size: Normal)   Pulse 73   Temp 98 F (36.7 C) (Temporal)   Ht 5\' 1"  (1.549 m)   Wt 132 lb (59.9 kg)   LMP 09/12/2022 (Exact Date)   SpO2 99%   BMI 24.94 kg/m  Body mass index is 24.94 kg/m.   General Appearance:    Alert, cooperative, no distress, appears stated age  Head:    Normocephalic, without obvious abnormality, atraumatic  Eyes:    PERRL, conjunctiva/corneas clear, EOM's intact, fundi    benign, both eyes  Ears:    Normal TM's and external ear canals, both ears  Nose:   Nares normal, septum midline, mucosa normal, no drainage    or sinus tenderness  Throat:   Lips, mucosa, and tongue normal; teeth and gums normal  Neck:   Supple, symmetrical, trachea midline, no adenopathy;    thyroid:  no enlargement/tenderness/nodules; no carotid   bruit or JVD  Back:     Symmetric, no curvature, ROM normal, no CVA tenderness  Lungs:     Clear to auscultation bilaterally, respirations unlabored  Chest Wall:    No tenderness or deformity   Heart:    Regular rate and rhythm, S1 and S2 normal, no murmur, rub or gallop  Breast Exam:    Deferred   Abdomen:     Soft, non-tender, bowel sounds active all four quadrants,    no masses, no organomegaly  Genitalia:    Deferred   Extremities:   Extremities normal, atraumatic, no cyanosis or edema  Pulses:   2+ and symmetric all extremities  Skin:   Skin color, texture, turgor normal, no rashes or lesions  Lymph nodes:   Cervical, supraclavicular, and axillary nodes normal  Neurologic:   CNII-XII intact, normal strength, sensation and reflexes    throughout     Results for orders placed or performed in visit on 09/27/22  POCT rapid strep A  Result Value Ref Range   Rapid Strep A Screen Negative Negative    Assessment/Plan:   Routine physical examination Today patient counseled on age appropriate routine health concerns for screening and prevention, each reviewed and up to date or declined. Immunizations reviewed and up to date or declined. Labs ordered and reviewed. Risk factors for depression reviewed and negative. Hearing function and visual acuity are intact. ADLs screened and addressed as needed. Functional  ability and level of safety reviewed and appropriate. Education, counseling and referrals performed based on assessed risks today. Patient provided with a copy of personalized plan for preventive services.  Encounter for lipid screening for cardiovascular disease Update lipid panel  Vitamin D deficiency Update Vit D  Encounter for screening for other viral diseases Update Hep C  Special screening for malignant neoplasms, colon Update cologuard  Muscle weakness (generalized) No red flags Referral for PT If no improvement or any worsening, will refer back to sports medicine  Sore throat No red flags on exam.  Will initiate supportive care -- strep test negative. Reviewed return precautions including worsening fever, SOB, worsening cough or other concerns. Push fluids and rest. I recommend that patient follow-up if symptoms worsen or persist despite treatment x 7-10 days, sooner if needed.     Jarold Motto, PA-C [facility]

## 2022-09-28 LAB — HEPATITIS C ANTIBODY: Hepatitis C Ab: NONREACTIVE

## 2022-09-30 ENCOUNTER — Ambulatory Visit: Payer: 59 | Admitting: Physical Therapy

## 2022-09-30 NOTE — Therapy (Deleted)
OUTPATIENT PHYSICAL THERAPY THORACOLUMBAR EVALUATION   Patient Name: Tracy Bradshaw MRN: [id number] DOB:09-Jun-1975, 47 y.o., female Today's Date: 09/30/2022  END OF SESSION:   Past Medical History:  Diagnosis Date   Abnormal Pap smear    HPV   Acute blood loss anemia 04/30/2012   Blood type O+    PONV (postoperative nausea and vomiting)    SAB (spontaneous abortion)    Uterine fibroid 04/06/2011   Submucus    Past Surgical History:  Procedure Laterality Date   CESAREAN SECTION  04/29/2012   Procedure: CESAREAN SECTION;  Surgeon: Robley Fries, MD;  Location: [facility];  Service: Obstetrics;  Laterality: N/A;   CESAREAN SECTION N/A 06/27/2015   Procedure: Repeat CESAREAN SECTION;  Surgeon: Noland Fordyce, MD;  Location: [facility];  Service: Obstetrics;  Laterality: N/A;  EDD: 06/30/15   Patient Active Problem List   Diagnosis Date Noted   Tachycardia 05/17/2019   Nonspecific abnormal electrocardiogram (ECG) (EKG) 05/17/2019    PCP: Jarold Motto, PA  REFERRING PROVIDER: Jarold Motto, PA  REFERRING DIAG: [phone] (ICD-10-CM) - Muscle weakness (generalized)  Rationale for Evaluation and Treatment: Rehabilitation  THERAPY DIAG:  No diagnosis found.  ONSET DATE: ***  SUBJECTIVE:                                                                                                                                                                                           SUBJECTIVE STATEMENT: ***  PERTINENT HISTORY:  NA  PAIN:  Are you having pain? Yes: NPRS scale: ***/10 Pain location: *** Pain description: *** Aggravating factors: *** Relieving factors: ***  PRECAUTIONS: {Therapy precautions:24002}  WEIGHT BEARING RESTRICTIONS: {Yes ***/No:24003}  FALLS:  Has patient fallen in last 6 months? {fallsyesno:27318}  LIVING ENVIRONMENT: Lives with: {OPRC lives with:25569::"lives with their family"} Lives in: {Lives in:25570} Stairs: {opstairs:27293} Has following  equipment at home: {Assistive devices:23999}  OCCUPATION: ***  PLOF: {PLOF:24004}  PATIENT GOALS: ***  NEXT MD VISIT: ***  OBJECTIVE:   DIAGNOSTIC FINDINGS:  09/28/21 Xray lumbar IMPRESSION: Mild degenerative changes without evidence of fractures or malalignment.   Xray right hip/pelvis IMPRESSION: Negative.    PATIENT SURVEYS:  {rehab surveys:24030}  SCREENING FOR RED FLAGS: Bowel or bladder incontinence: {Yes/No:304960894} Spinal tumors: {Yes/No:304960894} Cauda equina syndrome: {Yes/No:304960894} Compression fracture: {Yes/No:304960894} Abdominal aneurysm: {Yes/No:304960894}  COGNITION: Overall cognitive status: {cognition:24006}     SENSATION: {sensation:27233}  MUSCLE LENGTH: Hamstrings: Right *** deg; Left *** deg Thomas test: Right *** deg; Left *** deg  POSTURE: {posture:25561}  PALPATION: ***  LUMBAR ROM:   AROM eval  Flexion   Extension   Right lateral flexion   Left  lateral flexion   Right rotation   Left rotation    (Blank rows = not tested)   LE Measurements Lower Extremity Right 09/30/2022 Left 09/30/2022   A/PROM MMT A/PROM MMT  Hip Flexion      Hip Extension      Hip Abduction      Hip Adduction      Hip Internal rotation      Hip External rotation      Knee Flexion      Knee Extension      Ankle Dorsiflexion      Ankle Plantarflexion      Ankle Inversion      Ankle Eversion       (Blank rows = not tested) * pain ***            Lumbar  A/PROM:    10/08/2021      Flexion  50% limited     Extension 25% limited*      R ROT       L ROT       R SB  25% limited    L SB 25% limited                          * Pain              (Blank rows = not tested)              LE Measurements       Lower Extremity Right 10/08/2021 Left 10/08/2021    A/PROM MMT A/PROM MMT  Hip Flexion WNL 3+ WNL 4-  Hip Extension WNL 3+* WNL 4  Hip Abduction WNL 3+* WNL 4  Hip Adduction          Hip Internal rotation WNL   WNL    Hip External  rotation WNL   WNL    Knee Flexion   4-   4  Knee Extension   4   4+  Ankle Dorsiflexion   5   5  Ankle Plantarflexion          Ankle Inversion          Ankle Eversion           (Blank rows = not tested)            * pain  LUMBAR SPECIAL TESTS:  {lumbar special test:25242}  FUNCTIONAL TESTS:  {Functional tests:24029}  GAIT: Distance walked: *** Assistive device utilized: {Assistive devices:23999} Level of assistance: {Levels of assistance:24026} Comments: ***  TODAY'S TREATMENT:                                                                                                                              DATE: ***  09/30/2022  Therapeutic Exercise:  Aerobic: Supine: Prone:  Seated:  Standing: Neuromuscular Re-education: Manual Therapy: Therapeutic Activity: Self Care: Trigger Point Dry Needling:  Modalities:  PATIENT EDUCATION:  Education details: on current presentation, on HEP, on clinical outcomes score and POC Person educated: Patient Education method: Explanation, Demonstration, and Handouts Education comprehension: verbalized understanding   HOME EXERCISE PROGRAM: ***[id number]  ASSESSMENT:  CLINICAL IMPRESSION: Patient is a *** y.o. *** who was seen today for physical therapy evaluation and treatment for ***.    Patient presents to physical therapy evaluation and treatment for lumbar pain and right LE pain. Patient presents with weakness, ROM deficits and pain with palpation along right glutes and lumbar spine. Educated patient in current presentation and importance of support in sitting and performing opposite motion flexion. Patient tolerated session well reporting no pain end of session and support with towel roll in lumbar spine with sitting. Patient would benefit from skilled PT to improve overall function and QOL.  OBJECTIVE IMPAIRMENTS: {opptimpairments:25111}.   ACTIVITY LIMITATIONS: {activitylimitations:27494}  PARTICIPATION LIMITATIONS:  {participationrestrictions:25113}  PERSONAL FACTORS: {Personal factors:25162} are also affecting patient's functional outcome.   REHAB POTENTIAL: {rehabpotential:25112}  CLINICAL DECISION MAKING: {clinical decision making:25114}  EVALUATION COMPLEXITY: {Evaluation complexity:25115}   GOALS: Goals reviewed with patient? yes  SHORT TERM GOALS: Target date: 10/21/2022  Patient will be independent in self management strategies to improve quality of life and functional outcomes. Baseline: New Program Goal status: INITIAL  2.  Patient will report at least 50% improvement in overall symptoms and/or function to demonstrate improved functional mobility Baseline: 0% better Goal status: INITIAL  3.   Patient will demonstrate pain free lumbar ROM*** Baseline: painful Goal status: INITIAL     LONG TERM GOALS: Target date: 11/11/2022   Patient will report at least 75% improvement in overall symptoms and/or function to demonstrate improved functional mobility Baseline: 0% better Goal status: INITIAL  2.  Patient will improve score on FOTO outcomes measure to projected score to demonstrate overall improved function and QOL Baseline: see above Goal status: INITIAL  3.  Patient will demonstrate at least 4/5 MMT in bilateral LE to demonstrate improved strength Baseline: see above Goal status: INITIAL  4.  Patient will be able to sit for at least 30 minutes without having pain afterwards when transitioning from sit to stand*** Baseline: *** Goal status: INITIAL   PLAN:  PT FREQUENCY: 2x/week  PT DURATION: 6 weeks  PLANNED INTERVENTIONS: Therapeutic exercises, Therapeutic activity, Neuromuscular re-education, Balance training, Gait training, Patient/Family education, Self Care, Joint mobilization, Joint manipulation, Stair training, Vestibular training, Canalith repositioning, Orthotic/Fit training, DME instructions, Aquatic Therapy, Dry Needling, Electrical stimulation, Spinal  manipulation, Spinal mobilization, Cryotherapy, Moist heat, Traction, Ultrasound, Ionotophoresis 4mg /ml Dexamethasone, Manual therapy, and Re-evaluation.  PLAN FOR NEXT SESSION: hip strength/mobility, lumbar strength/mobility, repeated press ups, manual to right hip/lumbar STM***   8:23 AM, 09/30/22 Tereasa Coop, DPT Physical Therapy with [facility]

## 2022-10-24 LAB — COLOGUARD: COLOGUARD: POSITIVE — AB

## 2022-10-25 ENCOUNTER — Other Ambulatory Visit: Payer: Self-pay | Admitting: Physician Assistant

## 2022-10-25 DIAGNOSIS — R195 Other fecal abnormalities: Secondary | ICD-10-CM

## 2022-12-23 ENCOUNTER — Encounter: Payer: Self-pay | Admitting: Gastroenterology

## 2023-01-03 ENCOUNTER — Encounter: Payer: Self-pay | Admitting: Physician Assistant

## 2023-01-03 ENCOUNTER — Ambulatory Visit: Payer: 59 | Admitting: Physician Assistant

## 2023-01-03 VITALS — BP 120/80 | HR 93 | Temp 99.1°F | Ht 61.0 in | Wt 134.2 lb

## 2023-01-03 DIAGNOSIS — U071 COVID-19: Secondary | ICD-10-CM

## 2023-01-03 DIAGNOSIS — J029 Acute pharyngitis, unspecified: Secondary | ICD-10-CM

## 2023-01-03 LAB — POC COVID19 BINAXNOW: SARS Coronavirus 2 Ag: POSITIVE — AB

## 2023-01-03 LAB — POCT RAPID STREP A (OFFICE): Rapid Strep A Screen: NEGATIVE

## 2023-01-03 MED ORDER — AZITHROMYCIN 250 MG PO TABS: ORAL_TABLET | ORAL | 0 refills | Status: AC

## 2023-01-03 MED ORDER — BENZONATATE 100 MG PO CAPS: 100.0000 mg | ORAL_CAPSULE | Freq: Three times a day (TID) | ORAL | 0 refills | Status: DC | PRN

## 2023-01-03 NOTE — Progress Notes (Signed)
Tracy Bradshaw is a 47 y.o. female here for a new problem.  History of Present Illness:   Chief Complaint  Patient presents with   Sore Throat    Pt c/o sore throat, nasal congestion, cough, chills started on Saturday.    HPI  Flu-Like Symptoms: Complains of cough, sore throat, congestion, fatigue, and chills that began Saturday. Recently traveled to [facility] for a week long trip with her family. States she has previously contracted Covid in the past. Reports she has taken Theraflu to manage symptoms. Notes she has not been hydrating properly.  Denies fever or SHORTNESS OF BREATH, no prior asthma  Did have PNEUMONIA last winter and is concerned about developing this again.  Past Medical History:  Diagnosis Date   Abnormal Pap smear    HPV   Acute blood loss anemia 04/30/2012   Blood type O+    PONV (postoperative nausea and vomiting)    SAB (spontaneous abortion)    Uterine fibroid 04/06/2011   Submucus      Social History   Tobacco Use   Smoking status: Never   Smokeless tobacco: Never  Substance Use Topics   Alcohol use: Yes    Comment: very rare   Drug use: No    Past Surgical History:  Procedure Laterality Date   CESAREAN SECTION  04/29/2012   Procedure: CESAREAN SECTION;  Surgeon: Robley Fries, MD;  Location: [facility];  Service: Obstetrics;  Laterality: N/A;   CESAREAN SECTION N/A 06/27/2015   Procedure: Repeat CESAREAN SECTION;  Surgeon: Noland Fordyce, MD;  Location: [facility];  Service: Obstetrics;  Laterality: N/A;  EDD: 06/30/15    Family History  Problem Relation Age of Onset   Hypertension Mother    Thyroid disease Mother    Heart disease Father 77       Died age 63   Heart disease Brother 66       "Big Heart"    Other Neg Hx    Cancer Neg Hx     No Known Allergies  Current Medications:   Current Outpatient Medications:    acetaminophen (TYLENOL) 325 MG tablet, Take 2 tablets (650 mg total) by mouth every 4 (four) hours as needed (for pain scale <  4)., Disp: , Rfl:    albuterol (PROVENTIL) (2.5 MG/3ML) 0.083% nebulizer solution, Take 2.5 mg by nebulization every 6 (six) hours as needed., Disp: , Rfl:    azithromycin (ZITHROMAX) 250 MG tablet, Take 2 tablets on day 1, then 1 tablet daily on days 2 through 5, Disp: 6 tablet, Rfl: 0   benzonatate (TESSALON) 100 MG capsule, Take 1 capsule (100 mg total) by mouth 3 (three) times daily as needed for cough., Disp: 30 capsule, Rfl: 0   ibuprofen (ADVIL) 600 MG tablet, Take 600 mg by mouth as needed., Disp: , Rfl:    Magnesium Ascorbate POWD, 0.5 Scoops by Does not apply route daily in the afternoon., Disp: , Rfl:    Multiple Vitamins-Minerals (MULTIVITAMIN WITH MINERALS) tablet, Take 1 tablet by mouth daily., Disp: , Rfl:    SALINE NASAL MIST NA, Place into the nose as needed., Disp: , Rfl:    Review of Systems:   Review of Systems  Constitutional:  Positive for chills and malaise/fatigue.  HENT:  Positive for congestion and sore throat.   Respiratory:  Positive for cough.     Vitals:   Vitals:   01/03/23 1129  BP: 120/80  Pulse: 93  Temp: 99.1 F (37.3 C)  TempSrc: Temporal  SpO2: 99%  Weight: 134 lb 4 oz (60.9 kg)  Height: 5\' 1"  (1.549 m)     Body mass index is 25.37 kg/m.  Physical Exam:   Physical Exam Vitals and nursing note reviewed.  Constitutional:      General: She is not in acute distress.    Appearance: She is well-developed. She is not ill-appearing or toxic-appearing.  HENT:     Head: Normocephalic and atraumatic.     Right Ear: Tympanic membrane, ear canal and external ear normal. Tympanic membrane is not erythematous, retracted or bulging.     Left Ear: Tympanic membrane, ear canal and external ear normal. Tympanic membrane is not erythematous, retracted or bulging.     Nose: Nose normal.     Right Sinus: No maxillary sinus tenderness or frontal sinus tenderness.     Left Sinus: No maxillary sinus tenderness or frontal sinus tenderness.     Mouth/Throat:      Pharynx: Uvula midline. Posterior oropharyngeal erythema present.  Eyes:     General: Lids are normal.     Conjunctiva/sclera: Conjunctivae normal.  Neck:     Trachea: Trachea normal.  Cardiovascular:     Rate and Rhythm: Normal rate and regular rhythm.     Heart sounds: Normal heart sounds, S1 normal and S2 normal.  Pulmonary:     Effort: Pulmonary effort is normal.     Breath sounds: Normal breath sounds. No decreased breath sounds, wheezing, rhonchi or rales.  Lymphadenopathy:     Cervical: No cervical adenopathy.  Skin:    General: Skin is warm and dry.  Neurological:     Mental Status: She is alert.  Psychiatric:        Speech: Speech normal.        Behavior: Behavior normal. Behavior is cooperative.    Results for orders placed or performed in visit on 01/03/23  POCT rapid strep A  Result Value Ref Range   Rapid Strep A Screen Negative Negative  POC COVID-19  Result Value Ref Range   SARS Coronavirus 2 Ag Positive (A) Negative     Assessment and Plan:   Sore throat: COVID-19 No red flags on discussion, patient is not in any obvious distress during our visit. Discussed progression of most viral illness, and recommended supportive care at this point in time. I did however provide pocket rx for oral azithromycin should symptoms not improve as anticipated.  Start oral 100 mg tessalon Perles.  Discussed over the counter supportive care options, with recommendations to push fluids and rest. Reviewed return precautions including new/worsening fever, SOB, new/worsening cough or other concerns.  Recommended need to self-quarantine and practice social distancing until symptoms resolve. Discussed current recommendations for COVID testing. I recommend that patient follow-up if symptoms worsen or persist despite treatment x 7-10 days, sooner if needed.   I,Emily Lagle,acting as a Neurosurgeon for [employer], PA.,have documented all relevant documentation on the behalf of  Jarold Motto, PA,as directed by  Jarold Motto, PA while in the presence of Jarold Motto, [place].  I, Jarold Motto, [place], have reviewed all documentation for this visit. The documentation on 01/03/23 for the exam, diagnosis, procedures, and orders are all accurate and complete.  Jarold Motto, PA-C

## 2023-01-21 ENCOUNTER — Encounter: Payer: Self-pay | Admitting: Gastroenterology

## 2023-01-21 ENCOUNTER — Ambulatory Visit (AMBULATORY_SURGERY_CENTER): Payer: 59 | Admitting: *Deleted

## 2023-01-21 VITALS — Ht 61.0 in | Wt 134.0 lb

## 2023-01-21 DIAGNOSIS — R195 Other fecal abnormalities: Secondary | ICD-10-CM

## 2023-01-21 MED ORDER — NA SULFATE-K SULFATE-MG SULF 17.5-3.13-1.6 GM/177ML PO SOLN
1.0000 | Freq: Once | ORAL | 0 refills | Status: AC
Start: 2023-01-21 — End: 2023-01-21

## 2023-01-21 NOTE — Progress Notes (Signed)
Pre visit completed over telephone. Instructions forwarded through mychart and mailed Pt states all questions answered.   No egg or soy allergy known to patient  No issues known to pt with past sedation with any surgeries or procedures Patient denies ever being told they had issues or difficulty with intubation  No FH of Malignant Hyperthermia Pt is not on diet pills Pt is not on  home 02  Pt is not on blood thinners  Pt denies issues with constipation  No A fib or A flutter Have any cardiac testing pending--no  Pt instructed to use Singlecare.com or GoodRx for a price reduction on prep

## 2023-02-04 ENCOUNTER — Ambulatory Visit: Payer: 59 | Admitting: Physician Assistant

## 2023-02-04 ENCOUNTER — Encounter: Payer: Self-pay | Admitting: Physician Assistant

## 2023-02-04 VITALS — BP 110/80 | HR 78 | Temp 98.2°F | Ht 61.0 in | Wt 132.0 lb

## 2023-02-04 DIAGNOSIS — E559 Vitamin D deficiency, unspecified: Secondary | ICD-10-CM | POA: Diagnosis not present

## 2023-02-04 DIAGNOSIS — R748 Abnormal levels of other serum enzymes: Secondary | ICD-10-CM

## 2023-02-04 NOTE — Patient Instructions (Signed)
It was great to see you!  I will be in touch with all results and the plan soon.  Good luck next week at your colonoscopy!  Take care,  Jarold Motto PA-C

## 2023-02-04 NOTE — Progress Notes (Signed)
Tracy Bradshaw is a 47 y.o. female here for a follow up of a pre-existing problem.  History of Present Illness:   Chief Complaint  Patient presents with   Discuss vitamin levels    HPI  Vitamin levels Patient reports that she occasionally takes 5000 mg vitamin D supplements about 1-2 times a week. She states that she is not taking her multivitamin-mineral supplements nor vitamin B12 supplements. She is requesting to have her B12 levels checked today.  They have been elevated in the past.  Thyroid levels  Patient is requesting to have her thyroid levels because of fatigue and to be cautious.   She has a colonoscopy next week due to positive Cologuard earlier this year.  Past Medical History:  Diagnosis Date   Abnormal Pap smear    HPV   Acute blood loss anemia 04/30/2012   Blood type O+    PONV (postoperative nausea and vomiting)    SAB (spontaneous abortion)    Uterine fibroid 04/06/2011   Submucus      Social History   Tobacco Use   Smoking status: Never   Smokeless tobacco: Never  Substance Use Topics   Alcohol use: Yes    Comment: very rare   Drug use: No    Past Surgical History:  Procedure Laterality Date   CESAREAN SECTION  04/29/2012   Procedure: CESAREAN SECTION;  Surgeon: Robley Fries, MD;  Location: [facility];  Service: Obstetrics;  Laterality: N/A;   CESAREAN SECTION N/A 06/27/2015   Procedure: Repeat CESAREAN SECTION;  Surgeon: Noland Fordyce, MD;  Location: [facility];  Service: Obstetrics;  Laterality: N/A;  EDD: 06/30/15    Family History  Problem Relation Age of Onset   Hypertension Mother    Thyroid disease Mother    Heart disease Father 21       Died age 34   Heart disease Brother 76       "Big Heart"    Other Neg Hx    Cancer Neg Hx    Colon cancer Neg Hx    Cancer - Colon Neg Hx     No Known Allergies  Current Medications:   Current Outpatient Medications:    acetaminophen (TYLENOL) 325 MG tablet, Take 2 tablets (650 mg total) by mouth  every 4 (four) hours as needed (for pain scale < 4)., Disp: , Rfl:    albuterol (PROVENTIL) (2.5 MG/3ML) 0.083% nebulizer solution, Take 2.5 mg by nebulization every 6 (six) hours as needed., Disp: , Rfl:    ibuprofen (ADVIL) 600 MG tablet, Take 600 mg by mouth as needed., Disp: , Rfl:    Magnesium Ascorbate POWD, 0.5 Scoops by Does not apply route daily in the afternoon., Disp: , Rfl:    Multiple Vitamins-Minerals (MULTIVITAMIN WITH MINERALS) tablet, Take 1 tablet by mouth daily., Disp: , Rfl:    SALINE NASAL MIST NA, Place into the nose as needed., Disp: , Rfl:    Review of Systems:   ROS Negative unless otherwise specified per HPI.  Vitals:   Vitals:   02/04/23 1440  BP: 110/80  Pulse: 78  Temp: 98.2 F (36.8 C)  TempSrc: Temporal  SpO2: 97%  Weight: 132 lb (59.9 kg)  Height: 5\' 1"  (1.549 m)     Body mass index is 24.94 kg/m.  Physical Exam:   Physical Exam Constitutional:      General: She is not in acute distress.    Appearance: Normal appearance. She is not ill-appearing.  HENT:  Head: Normocephalic and atraumatic.     Right Ear: External ear normal.     Left Ear: External ear normal.  Eyes:     Extraocular Movements: Extraocular movements intact.     Pupils: Pupils are equal, round, and reactive to light.  Cardiovascular:     Rate and Rhythm: Normal rate and regular rhythm.     Heart sounds: Normal heart sounds. No murmur heard.    No gallop.  Pulmonary:     Effort: Pulmonary effort is normal. No respiratory distress.     Breath sounds: Normal breath sounds. No wheezing or rales.  Skin:    General: Skin is warm and dry.  Neurological:     Mental Status: She is alert and oriented to person, place, and time.  Psychiatric:        Judgment: Judgment normal.     Assessment and Plan:   Elevated vitamin B12 level Update B-12, folate, iron panel and add smear Consider reaching out to hematology if blood work is still abnormal -- they have declined  referral in the past for elevated B-12 levels  Vitamin D deficiency Update vitamin D and provide recommendations accordingly   I,Verona Buck,acting as a scribe for [employer], PA.,have documented all relevant documentation on the behalf of Jarold Motto, PA,as directed by  Jarold Motto, PA while in the presence of Jarold Motto, [place].  I, Jarold Motto, [place], have reviewed all documentation for this visit. The documentation on 02/04/23 for the exam, diagnosis, procedures, and orders are all accurate and complete.  Jarold Motto, PA-C

## 2023-02-07 LAB — CBC WITH DIFFERENTIAL/PLATELET
Absolute Monocytes: 342 {cells}/uL (ref 200–950)
Basophils Absolute: 53 {cells}/uL (ref 0–200)
Basophils Relative: 0.9 %
Eosinophils Absolute: 112 {cells}/uL (ref 15–500)
Eosinophils Relative: 1.9 %
HCT: 36.8 % (ref 35.0–45.0)
Hemoglobin: 12.1 g/dL (ref 11.7–15.5)
Lymphs Abs: 2024 {cells}/uL (ref 850–3900)
MCH: 30.2 pg (ref 27.0–33.0)
MCHC: 32.9 g/dL (ref 32.0–36.0)
MCV: 91.8 fL (ref 80.0–100.0)
MPV: 11 fL (ref 7.5–12.5)
Monocytes Relative: 5.8 %
Neutro Abs: 3369 {cells}/uL (ref 1500–7800)
Neutrophils Relative %: 57.1 %
Platelets: 262 10*3/uL (ref 140–400)
RBC: 4.01 10*6/uL (ref 3.80–5.10)
RDW: 12.5 % (ref 11.0–15.0)
Total Lymphocyte: 34.3 %
WBC: 5.9 10*3/uL (ref 3.8–10.8)

## 2023-02-07 LAB — VITAMIN D 25 HYDROXY (VIT D DEFICIENCY, FRACTURES): Vit D, 25-Hydroxy: 30 ng/mL (ref 30–100)

## 2023-02-07 LAB — IRON,TIBC AND FERRITIN PANEL
%SAT: 32 % (ref 16–45)
Ferritin: 9 ng/mL — ABNORMAL LOW (ref 16–232)
Iron: 112 ug/dL (ref 40–190)
TIBC: 345 ug/dL (ref 250–450)

## 2023-02-07 LAB — PATHOLOGIST SMEAR REVIEW

## 2023-02-07 LAB — B12 AND FOLATE PANEL
Folate: 19.3 ng/mL
Vitamin B-12: >2000 pg/mL — ABNORMAL HIGH (ref 200–1100)

## 2023-02-07 LAB — TSH: TSH: 1.65 m[IU]/L

## 2023-02-11 ENCOUNTER — Ambulatory Visit (AMBULATORY_SURGERY_CENTER): Payer: 59 | Admitting: Gastroenterology

## 2023-02-11 ENCOUNTER — Encounter: Payer: Self-pay | Admitting: Gastroenterology

## 2023-02-11 VITALS — BP 109/74 | HR 72 | Temp 98.0°F | Resp 14 | Ht 61.0 in | Wt 134.0 lb

## 2023-02-11 DIAGNOSIS — R195 Other fecal abnormalities: Secondary | ICD-10-CM | POA: Diagnosis not present

## 2023-02-11 DIAGNOSIS — D125 Benign neoplasm of sigmoid colon: Secondary | ICD-10-CM | POA: Diagnosis not present

## 2023-02-11 DIAGNOSIS — Z1211 Encounter for screening for malignant neoplasm of colon: Secondary | ICD-10-CM | POA: Diagnosis present

## 2023-02-11 MED ORDER — SODIUM CHLORIDE 0.9 % IV SOLN: 500.0000 mL | Freq: Once | INTRAVENOUS | Status: DC

## 2023-02-11 NOTE — Progress Notes (Signed)
Sedate, gd SR, tolerated procedure well, VSS, report to RN 

## 2023-02-11 NOTE — Progress Notes (Signed)
Called to room to assist during endoscopic procedure.  Patient ID and intended procedure confirmed with present staff. Received instructions for my participation in the procedure from the performing physician.  

## 2023-02-11 NOTE — Patient Instructions (Addendum)
Recommendation:           - Patient has a contact number available for                            emergencies. The signs and symptoms of potential                            delayed complications were discussed with the                            patient. Return to normal activities tomorrow.                            Written discharge instructions were provided to the                            patient.                           - Resume previous diet.                           - Continue present medications.                           - Await pathology results.                           - Repeat colonoscopy (date not yet determined) for                            surveillance based on pathology results.  Handouts on polyps and hemorrhoids given.  YOU HAD AN ENDOSCOPIC PROCEDURE TODAY AT THE [facility]:   Refer to the procedure report that was given to you for any specific questions about what was found during the examination.  If the procedure report does not answer your questions, please call your gastroenterologist to clarify.  If you requested that your care partner not be given the details of your procedure findings, then the procedure report has been included in a sealed envelope for you to review at your convenience later.  YOU SHOULD EXPECT: Some feelings of bloating in the abdomen. Passage of more gas than usual.  Walking can help get rid of the air that was put into your GI tract during the procedure and reduce the bloating. If you had a lower endoscopy (such as a colonoscopy or flexible sigmoidoscopy) you may notice spotting of blood in your stool or on the toilet paper. If you underwent a bowel prep for your procedure, you may not have a normal bowel movement for a few days.  Please Note:  You might notice some irritation and congestion in your nose or some drainage.  This is from the oxygen used during your procedure.  There is no need for concern and it should clear up  in a day or so.  SYMPTOMS TO REPORT IMMEDIATELY:  Following lower endoscopy (colonoscopy or flexible sigmoidoscopy):  Excessive amounts of blood in the stool  Significant tenderness or worsening of abdominal pains  Swelling of the abdomen that is  new, acute  Fever of 100F or higher  For urgent or emergent issues, a gastroenterologist can be reached at any hour by calling [phone]. Do not use MyChart messaging for urgent concerns.    DIET:  We do recommend a small meal at first, but then you may proceed to your regular diet.  Drink plenty of fluids but you should avoid alcoholic beverages for 24 hours.  ACTIVITY:  You should plan to take it easy for the rest of today and you should NOT DRIVE or use heavy machinery until tomorrow (because of the sedation medicines used during the test).    FOLLOW UP: Our staff will call the number listed on your records the next business day following your procedure.  We will call around 7:15- 8:00 am to check on you and address any questions or concerns that you may have regarding the information given to you following your procedure. If we do not reach you, we will leave a message.     If any biopsies were taken you will be contacted by phone or by letter within the next 1-3 weeks.  Please call us at [phone] if you have not heard about the biopsies in 3 weeks.    SIGNATURES/CONFIDENTIALITY: You and/or your care partner have signed paperwork which will be entered into your electronic medical record.  These signatures attest to the fact that that the information above on your After Visit Summary has been reviewed and is understood.  Full responsibility of the confidentiality of this discharge information lies with you and/or your care-partner.

## 2023-02-11 NOTE — Op Note (Signed)
[facility] Patient Name: Tracy Bradshaw Procedure Date: 02/11/2023 9:09 AM MRN: [id number] Endoscopist: Lorin Picket E. Tomasa Rand , MD, [id number] Age: 47 Referring MD:  Date of Birth: 05/22/1976 Gender: Female Account #: [id number] Procedure:                Colonoscopy Indications:              Positive Cologuard test Medicines:                Monitored Anesthesia Care Procedure:                Pre-Anesthesia Assessment:                           - Prior to the procedure, a History and Physical                            was performed, and patient medications and                            allergies were reviewed. The patient's tolerance of                            previous anesthesia was also reviewed. The risks                            and benefits of the procedure and the sedation                            options and risks were discussed with the patient.                            All questions were answered, and informed consent                            was obtained. Prior Anticoagulants: The patient has                            taken no anticoagulant or antiplatelet agents. ASA                            Grade Assessment: II - A patient with mild systemic                            disease. After reviewing the risks and benefits,                            the patient was deemed in satisfactory condition to                            undergo the procedure.                           After obtaining informed consent, the colonoscope  was passed under direct vision. Throughout the                            procedure, the patient's blood pressure, pulse, and                            oxygen saturations were monitored continuously. The                            Olympus CF-HQ190L ([id number]) Colonoscope was                            introduced through the anus and advanced to the the                            terminal ileum, with  identification of the                            appendiceal orifice and IC valve. The colonoscopy                            was performed without difficulty. The patient                            tolerated the procedure well. The quality of the                            bowel preparation was excellent. The terminal                            ileum, ileocecal valve, appendiceal orifice, and                            rectum were photographed. The bowel preparation                            used was SUPREP via split dose instruction. Scope In: 9:20:20 AM Scope Out: 9:35:03 AM Scope Withdrawal Time: 0 hours 10 minutes 40 seconds  Total Procedure Duration: 0 hours 14 minutes 43 seconds  Findings:                 The perianal and digital rectal examinations were                            normal. Pertinent negatives include normal                            sphincter tone and no palpable rectal lesions.                           A 17 mm polyp was found in the sigmoid colon. The                            polyp was pedunculated. The polyp was removed  with                            a hot snare. Resection and retrieval were complete.                            Estimated blood loss: none.                           The exam was otherwise normal throughout the                            examined colon.                           The terminal ileum appeared normal.                           Non-bleeding internal hemorrhoids were found during                            retroflexion. The hemorrhoids were Grade I                            (internal hemorrhoids that do not prolapse).                           No additional abnormalities were found on                            retroflexion. Complications:            No immediate complications. Estimated Blood Loss:     Estimated blood loss: none. Impression:               - One 17 mm polyp in the sigmoid colon, removed                             with a hot snare. Resected and retrieved.                           - The examined portion of the ileum was normal.                           - Non-bleeding internal hemorrhoids. Recommendation:           - Patient has a contact number available for                            emergencies. The signs and symptoms of potential                            delayed complications were discussed with the                            patient. Return to normal activities tomorrow.  Written discharge instructions were provided to the                            patient.                           - Resume previous diet.                           - Continue present medications.                           - Await pathology results.                           - Repeat colonoscopy (date not yet determined) for                            surveillance based on pathology results. Janie Strothman E. Tomasa Rand, MD 02/11/2023 9:40:13 AM This report has been signed electronically.

## 2023-02-11 NOTE — Progress Notes (Signed)
[facility] History and Physical   Primary Care Physician:  Jarold Motto, [place]   Reason for Procedure:   Positive Cologuard  Plan:    Colonoscopy     HPI: Tracy Bradshaw is a 47 y.o. female undergoing colonoscopy following a positive Cologuard test in May of this year.  She has no family history of colon cancer and no chronic GI symptoms.  She sometimes will see small amounts of blood in the stool.   Past Medical History:  Diagnosis Date   Abnormal Pap smear    HPV   Acute blood loss anemia 04/30/2012   Blood type O+    PONV (postoperative nausea and vomiting)    SAB (spontaneous abortion)    Uterine fibroid 04/06/2011   Submucus     Past Surgical History:  Procedure Laterality Date   CESAREAN SECTION  04/29/2012   Procedure: CESAREAN SECTION;  Surgeon: Robley Fries, MD;  Location: [facility];  Service: Obstetrics;  Laterality: N/A;   CESAREAN SECTION N/A 06/27/2015   Procedure: Repeat CESAREAN SECTION;  Surgeon: Noland Fordyce, MD;  Location: [facility];  Service: Obstetrics;  Laterality: N/A;  EDD: 06/30/15    Prior to Admission medications   Medication Sig Start Date End Date Taking? Authorizing Provider  acetaminophen (TYLENOL) 325 MG tablet Take 2 tablets (650 mg total) by mouth every 4 (four) hours as needed (for pain scale < 4). 06/29/15   Marlinda Mike, CNM  albuterol (PROVENTIL) (2.5 MG/3ML) 0.083% nebulizer solution Take 2.5 mg by nebulization every 6 (six) hours as needed. 04/17/22   [provider]  ibuprofen (ADVIL) 600 MG tablet Take 600 mg by mouth as needed.    [provider]  Magnesium Ascorbate POWD 0.5 Scoops by Does not apply route daily in the afternoon.    [provider]  Multiple Vitamins-Minerals (MULTIVITAMIN WITH MINERALS) tablet Take 1 tablet by mouth daily.    [provider]  SALINE NASAL MIST NA Place into the nose as needed.    [provider]    Current Outpatient Medications  Medication Sig  Dispense Refill   acetaminophen (TYLENOL) 325 MG tablet Take 2 tablets (650 mg total) by mouth every 4 (four) hours as needed (for pain scale < 4).     albuterol (PROVENTIL) (2.5 MG/3ML) 0.083% nebulizer solution Take 2.5 mg by nebulization every 6 (six) hours as needed.     ibuprofen (ADVIL) 600 MG tablet Take 600 mg by mouth as needed.     Magnesium Ascorbate POWD 0.5 Scoops by Does not apply route daily in the afternoon.     Multiple Vitamins-Minerals (MULTIVITAMIN WITH MINERALS) tablet Take 1 tablet by mouth daily.     SALINE NASAL MIST NA Place into the nose as needed.     Current Facility-Administered Medications  Medication Dose Route Frequency Provider Last Rate Last Admin   0.9 %  sodium chloride infusion  500 mL Intravenous Once Jenel Lucks, MD        Allergies as of 02/11/2023   (No Known Allergies)    Family History  Problem Relation Age of Onset   Hypertension Mother    Thyroid disease Mother    Heart disease Father 85       Died age 51   Heart disease Brother 57       "Big Heart"    Other Neg Hx    Cancer Neg Hx    Colon cancer Neg Hx    Cancer - Colon Neg  Hx     Social History   Socioeconomic History   Marital status: Married    Spouse name: Not on file   Number of children: Not on file   Years of education: Not on file   Highest education level: Not on file  Occupational History   Not on file  Tobacco Use   Smoking status: Never   Smokeless tobacco: Never  Vaping Use   Vaping status: Not on file  Substance and Sexual Activity   Alcohol use: Yes    Comment: very rare   Drug use: No   Sexual activity: Not on file  Other Topics Concern   Not on file  Social History Narrative   Married   Two children   Stays at home   Social Determinants of Health   Financial Resource Strain: Not on file  Food Insecurity: Not on file  Transportation Needs: Not on file  Physical Activity: Not on file  Stress: Not on file  Social Connections: Unknown  (04/19/2022)   Received from [facility]   Social Network    Social Network: Not on file  Intimate Partner Violence: Unknown (04/19/2022)   Received from [facility]   HITS    Physically Hurt: Not on file    Insult or Talk Down To: Not on file    Threaten Physical Harm: Not on file    Scream or Curse: Not on file    Review of Systems:  All other review of systems negative except as mentioned in the HPI.  Physical Exam: Vital signs BP (!) 137/96   Pulse 87   Temp 98 F (36.7 C)   Ht 5\' 1"  (1.549 m)   Wt 134 lb (60.8 kg)   LMP 01/29/2023 (Exact Date)   SpO2 100%   BMI 25.32 kg/m   General:   Alert,  Well-developed, well-nourished, pleasant and cooperative in NAD Airway:  Mallampati 1 Lungs:  Clear throughout to auscultation.   Heart:  Regular rate and rhythm; no murmurs, clicks, rubs,  or gallops. Abdomen:  Soft, nontender and nondistended. Normal bowel sounds.   Neuro/Psych:  Normal mood and affect. A and O x 3   Ivy Meriwether E. Tomasa Rand, MD [facility]

## 2023-02-14 ENCOUNTER — Telehealth: Payer: Self-pay | Admitting: *Deleted

## 2023-02-14 NOTE — Telephone Encounter (Signed)
  Follow up Call-     02/11/2023    8:11 AM  Call back number  Post procedure Call Back phone  # [phone]  Permission to leave phone message Yes     Patient questions:  Do you have a fever, pain , or abdominal swelling? No. Pain Score  0 *  Have you tolerated food without any problems? Yes.    Have you been able to return to your normal activities? Yes.    Do you have any questions about your discharge instructions: Diet   No. Medications  No. Follow up visit  No.  Do you have questions or concerns about your Care? No.  Actions: * If pain score is 4 or above: No action needed, pain <4.

## 2023-02-17 ENCOUNTER — Ambulatory Visit: Payer: 59 | Admitting: Physician Assistant

## 2023-02-17 LAB — SURGICAL PATHOLOGY

## 2023-02-22 ENCOUNTER — Encounter: Payer: Self-pay | Admitting: Physician Assistant

## 2023-02-22 ENCOUNTER — Ambulatory Visit: Payer: 59 | Admitting: Physician Assistant

## 2023-02-22 VITALS — BP 120/78 | HR 74 | Temp 98.0°F | Ht 61.0 in | Wt 132.2 lb

## 2023-02-22 DIAGNOSIS — K648 Other hemorrhoids: Secondary | ICD-10-CM | POA: Diagnosis not present

## 2023-02-22 DIAGNOSIS — R748 Abnormal levels of other serum enzymes: Secondary | ICD-10-CM | POA: Diagnosis not present

## 2023-02-22 DIAGNOSIS — D500 Iron deficiency anemia secondary to blood loss (chronic): Secondary | ICD-10-CM

## 2023-02-22 NOTE — Progress Notes (Signed)
Tracy Bradshaw is a 47 y.o. female here for a follow up of a pre-existing problem.  History of Present Illness:   Chief Complaint  Patient presents with   Results    Discuss lab results    HPI  Positive Colorectal Screening/Internal Hemorrhoids: Her cologuard on 10/25/22 resulted positive. Due to positive result she followed-up with a diagnostic colonoscopy on 9/13. 1 benign polyp removed and internal hemorrhoids found.  Endorses occasional constipation, no longer than 48 hours without a bowel movement.  Denies blood when wiping.   Anemia: Discussed importance of Ferritin and iron and ways to supplement. Endorses excessive hair loss 6 months ago, this was after a COVID infection  Elevated B-12:  Reports that she is not taking multivitamins. Describes diet: bison about every week, rarely red meats, mostly chicken. Doesn't consume dairy daily. Drinks water throughout the day, was using Liquid IV occasionally, but not for the last month.     Past Medical History:  Diagnosis Date   Abnormal Pap smear    HPV   Acute blood loss anemia 04/30/2012   Blood type O+    PONV (postoperative nausea and vomiting)    SAB (spontaneous abortion)    Uterine fibroid 04/06/2011   Submucus      Social History   Tobacco Use   Smoking status: Never   Smokeless tobacco: Never  Substance Use Topics   Alcohol use: Yes    Comment: very rare   Drug use: No    Past Surgical History:  Procedure Laterality Date   CESAREAN SECTION  04/29/2012   Procedure: CESAREAN SECTION;  Surgeon: Robley Fries, MD;  Location: [facility];  Service: Obstetrics;  Laterality: N/A;   CESAREAN SECTION N/A 06/27/2015   Procedure: Repeat CESAREAN SECTION;  Surgeon: Noland Fordyce, MD;  Location: [facility];  Service: Obstetrics;  Laterality: N/A;  EDD: 06/30/15    Family History  Problem Relation Age of Onset   Hypertension Mother    Thyroid disease Mother    Heart disease Father 59       Died age 36   Heart disease  Brother 62       "Big Heart"    Other Neg Hx    Cancer Neg Hx    Colon cancer Neg Hx    Cancer - Colon Neg Hx     No Known Allergies  Current Medications:   Current Outpatient Medications:    acetaminophen (TYLENOL) 325 MG tablet, Take 2 tablets (650 mg total) by mouth every 4 (four) hours as needed (for pain scale < 4)., Disp: , Rfl:    albuterol (PROVENTIL) (2.5 MG/3ML) 0.083% nebulizer solution, Take 2.5 mg by nebulization every 6 (six) hours as needed., Disp: , Rfl:    ibuprofen (ADVIL) 600 MG tablet, Take 600 mg by mouth as needed., Disp: , Rfl:    Magnesium Ascorbate POWD, 0.5 Scoops by Does not apply route daily in the afternoon., Disp: , Rfl:    Multiple Vitamins-Minerals (MULTIVITAMIN WITH MINERALS) tablet, Take 1 tablet by mouth daily., Disp: , Rfl:    SALINE NASAL MIST NA, Place into the nose as needed., Disp: , Rfl:    Review of Systems:   ROS See pertinent positives and negatives as per the HPI.  Vitals:   Vitals:   02/22/23 0837  BP: 120/78  Pulse: 74  Temp: 98 F (36.7 C)  TempSrc: Temporal  SpO2: 99%  Weight: 132 lb 4 oz (60 kg)  Height: 5\' 1"  (1.549 m)  Body mass index is 24.99 kg/m.  Physical Exam:   Physical Exam Vitals and nursing note reviewed.  Constitutional:      General: She is not in acute distress.    Appearance: She is well-developed. She is not ill-appearing or toxic-appearing.  Cardiovascular:     Rate and Rhythm: Normal rate and regular rhythm.     Pulses: Normal pulses.     Heart sounds: Normal heart sounds, S1 normal and S2 normal.  Pulmonary:     Effort: Pulmonary effort is normal.     Breath sounds: Normal breath sounds.  Skin:    General: Skin is warm and dry.  Neurological:     Mental Status: She is alert.     GCS: GCS eye subscore is 4. GCS verbal subscore is 5. GCS motor subscore is 6.  Psychiatric:        Speech: Speech normal.        Behavior: Behavior normal. Behavior is cooperative.     Assessment and Plan:    Internal hemorrhoids No red flags on discussion Encouraged prevention of constipation with adequate fiber, water and exercise Consider as needed miralax  Elevated vitamin B12 level Will add homocysteine and methylmalonic acid for further evaluation Next steps dependent on results  Iron deficiency anemia due to chronic blood loss Discussed supplementation of iron and provided recommendations on supplementing as well as maximizing intake I have asked her to reach out if any intolerance to by mouth iron  I,Emily Lagle,acting as a scribe for [employer], PA.,have documented all relevant documentation on the behalf of Jarold Motto, PA,as directed by  Jarold Motto, PA while in the presence of Jarold Motto, [place].  I, Jarold Motto, [place], have reviewed all documentation for this visit. The documentation on 02/22/23 for the exam, diagnosis, procedures, and orders are all accurate and complete.  Jarold Motto, PA-C

## 2023-02-22 NOTE — Progress Notes (Signed)
Tracy Bradshaw,  The polyp that I removed during your recent procedure was completely benign but was proven to be a "pre-cancerous" polyp that MAY have grown into cancers if it had not been removed.  Studies shows that at least 20% of women over age 48 and 30% of men over age 68 have pre-cancerous polyps.  Based on current nationally recognized surveillance guidelines, I recommend that you have a repeat colonoscopy in 3 years.   If you develop any new rectal bleeding, abdominal pain or significant bowel habit changes, please contact me before then.

## 2023-02-22 NOTE — Patient Instructions (Signed)
It was great to see you!  To boost your iron stores (your ferritin levels), I would like for you to start supplementing with oral iron. Most patients feel best if we can get their ferritin levels around 75. For best absorption, I recommend taking a Vitamin C supplement with your Iron supplement. Please go to your local drug store or grocery store and purchase an over-the-counter Iron 65 mg supplement (ferrous sulfate 325 mg) such as [employer] brand (however any brand is fine) and also purchase an over-the-counter vitamin C supplement of [phone] mg. I recommend taking the iron and vitamin C supplement of your choice together, on an empty stomach. Take this combination every other day. We can recheck your levels as soon as 3 months to see how your ferritin is responding.  I will be in touch with your blood work results.  Take care,  Jarold Motto PA-C

## 2023-02-23 LAB — HOMOCYSTEINE: Homocysteine: 7 umol/L (ref <10.4)

## 2023-02-25 LAB — METHYLMALONIC ACID, SERUM: Methylmalonic Acid, Quant: 85 nmol/L (ref 55–335)

## 2023-02-28 ENCOUNTER — Other Ambulatory Visit: Payer: Self-pay | Admitting: *Deleted

## 2023-02-28 DIAGNOSIS — E678 Other specified hyperalimentation: Secondary | ICD-10-CM

## 2023-03-04 ENCOUNTER — Ambulatory Visit
Admission: RE | Admit: 2023-03-04 | Discharge: 2023-03-04 | Disposition: A | Discharge status: Home / Self Care | Payer: 59 | Source: Ambulatory Visit | Attending: Physician Assistant

## 2023-03-04 DIAGNOSIS — E678 Other specified hyperalimentation: Secondary | ICD-10-CM

## 2023-03-09 LAB — HM MAMMOGRAPHY

## 2023-03-10 ENCOUNTER — Encounter: Payer: Self-pay | Admitting: Physician Assistant

## 2023-05-16 NOTE — Progress Notes (Signed)
Tracy Bradshaw is a 47 y.o. female here for a new problem.  History of Present Illness:  There are no preventive care reminders to display for this patient. Chief Complaint  Patient presents with   Mass    Pt c/o mass at throat area, x several months.   HPI  Mass (Right-side Anterior Throat): Complains of a mass on her neck for the past several months that she noticed recently has grown.  She states that she wasn't initially concerned when she noticed the mass, but about 2 weeks ago while washing her face she felt it and noticed it had grown since she had noticed it.  Notes hx of B12 elevation, panel done 02/04/23 resulted with B12 >2000.  Denies pain, difficulty swallowing, cough, reflux, heart burn, unintentional weight loss, or night sweats.   Anemia: Managed with an iron supplement - tolerates well.  Last iron panel 02/04/23 resulted with 112 iron, 345 TIBC, 32 %SAT, and 9 Ferritin.  Would like her iron rechecked today.  Bone Aches:  Reports that her knees and arms have been aching and occasionally will experience tingling in her arms.  She would like to have a Bone Density Scan done.  Her Vitamin-D from 02/04/23 was 30. Denies fhx of osteoporosis or hx of fractures.   Past Medical History:  Diagnosis Date   Abnormal Pap smear    HPV   Acute blood loss anemia 04/30/2012   Blood type O+    PONV (postoperative nausea and vomiting)    SAB (spontaneous abortion)    Uterine fibroid 04/06/2011   Submucus     Social History   Tobacco Use   Smoking status: Never   Smokeless tobacco: Never  Substance Use Topics   Alcohol use: Yes    Comment: very rare   Drug use: No   Past Surgical History:  Procedure Laterality Date   CESAREAN SECTION  04/29/2012   Procedure: CESAREAN SECTION;  Surgeon: Robley Fries, MD;  Location: [facility];  Service: Obstetrics;  Laterality: N/A;   CESAREAN SECTION N/A 06/27/2015   Procedure: Repeat CESAREAN SECTION;  Surgeon: Noland Fordyce, MD;  Location: [facility];  Service: Obstetrics;  Laterality: N/A;  EDD: 06/30/15   Family History  Problem Relation Age of Onset   Hypertension Mother    Thyroid disease Mother    Heart disease Father 31       Died age 3   Heart disease Brother 75       "Big Heart"    Other Neg Hx    Cancer Neg Hx    Colon cancer Neg Hx    Cancer - Colon Neg Hx    No Known Allergies Current Medications:   Current Outpatient Medications:    acetaminophen (TYLENOL) 325 MG tablet, Take 2 tablets (650 mg total) by mouth every 4 (four) hours as needed (for pain scale < 4)., Disp: , Rfl:    albuterol (PROVENTIL) (2.5 MG/3ML) 0.083% nebulizer solution, Take 2.5 mg by nebulization every 6 (six) hours as needed., Disp: , Rfl:    ibuprofen (ADVIL) 600 MG tablet, Take 600 mg by mouth as needed., Disp: , Rfl:    Magnesium Ascorbate POWD, 0.5 Scoops by Does not apply route daily in the afternoon., Disp: , Rfl:    Multiple Vitamins-Minerals (MULTIVITAMIN WITH MINERALS) tablet, Take 1 tablet by mouth daily., Disp: , Rfl:    SALINE NASAL MIST NA, Place into the nose as needed., Disp: , Rfl:   Review of Systems:  ROS See pertinent positives and negatives as per the HPI.  Vitals:   Vitals:   05/20/23 0847  BP: 118/70  Pulse: 69  Temp: 97.8 F (36.6 C)  TempSrc: Temporal  SpO2: 99%  Weight: 135 lb (61.2 kg)  Height: 5\' 1"  (1.549 m)     Body mass index is 25.51 kg/m.  Physical Exam:   Physical Exam Vitals and nursing note reviewed.  Constitutional:      General: She is not in acute distress.    Appearance: She is well-developed. She is not ill-appearing or toxic-appearing.  Neck:     Thyroid: Thyromegaly present.  Cardiovascular:     Rate and Rhythm: Normal rate and regular rhythm.     Pulses: Normal pulses.     Heart sounds: Normal heart sounds, S1 normal and S2 normal.  Pulmonary:     Effort: Pulmonary effort is normal.     Breath sounds: Normal breath sounds.  Lymphadenopathy:     Cervical:     Right  cervical: No superficial, deep or posterior cervical adenopathy.    Left cervical: No superficial, deep or posterior cervical adenopathy.  Skin:    General: Skin is warm and dry.  Neurological:     Mental Status: She is alert.     GCS: GCS eye subscore is 4. GCS verbal subscore is 5. GCS motor subscore is 6.  Psychiatric:        Speech: Speech normal.        Behavior: Behavior normal. Behavior is cooperative.     Assessment and Plan:   Thyromegaly Questionable enlarging thyroid on exam  Will order ultrasound and thyroid testing  Iron deficiency anemia due to chronic blood loss Update iron panel and provide recommendations accordingly  Bone pain; Estrogen deficiency Unclear etiology Will order blood work and UA for further evaluation Will order bone density per patient request  I,Emily Lagle,acting as a scribe for [employer], PA.,have documented all relevant documentation on the behalf of Jarold Motto, PA,as directed by  Jarold Motto, PA while in the presence of Jarold Motto, [place].  I, Jarold Motto, [place], have reviewed all documentation for this visit. The documentation on 05/20/23 for the exam, diagnosis, procedures, and orders are all accurate and complete.  Jarold Motto, PA-C

## 2023-05-20 ENCOUNTER — Encounter: Payer: Self-pay | Admitting: Physician Assistant

## 2023-05-20 ENCOUNTER — Ambulatory Visit: Payer: 59 | Admitting: Physician Assistant

## 2023-05-20 VITALS — BP 118/70 | HR 69 | Temp 97.8°F | Ht 61.0 in | Wt 135.0 lb

## 2023-05-20 DIAGNOSIS — E010 Iodine-deficiency related diffuse (endemic) goiter: Secondary | ICD-10-CM

## 2023-05-20 DIAGNOSIS — D500 Iron deficiency anemia secondary to blood loss (chronic): Secondary | ICD-10-CM | POA: Diagnosis not present

## 2023-05-20 DIAGNOSIS — M898X9 Other specified disorders of bone, unspecified site: Secondary | ICD-10-CM | POA: Diagnosis not present

## 2023-05-20 DIAGNOSIS — E2839 Other primary ovarian failure: Secondary | ICD-10-CM | POA: Diagnosis not present

## 2023-05-20 LAB — URINALYSIS, ROUTINE W REFLEX MICROSCOPIC
Bilirubin Urine: NEGATIVE
Ketones, ur: NEGATIVE
Leukocytes,Ua: NEGATIVE
Nitrite: NEGATIVE
Specific Gravity, Urine: 1.02 (ref 1.000–1.030)
Total Protein, Urine: NEGATIVE
Urine Glucose: NEGATIVE
Urobilinogen, UA: 0.2 (ref 0.0–1.0)
WBC, UA: NONE SEEN (ref 0–?)
pH: 6.5 (ref 5.0–8.0)

## 2023-05-20 LAB — CBC WITH DIFFERENTIAL/PLATELET
Basophils Absolute: 0 10*3/uL (ref 0.0–0.1)
Basophils Relative: 0.4 % (ref 0.0–3.0)
Eosinophils Absolute: 0.1 10*3/uL (ref 0.0–0.7)
Eosinophils Relative: 1 % (ref 0.0–5.0)
HCT: 37.9 % (ref 36.0–46.0)
Hemoglobin: 12.7 g/dL (ref 12.0–15.0)
Lymphocytes Relative: 27.2 % (ref 12.0–46.0)
Lymphs Abs: 1.5 10*3/uL (ref 0.7–4.0)
MCHC: 33.5 g/dL (ref 30.0–36.0)
MCV: 92.5 fL (ref 78.0–100.0)
Monocytes Absolute: 0.4 10*3/uL (ref 0.1–1.0)
Monocytes Relative: 6.3 % (ref 3.0–12.0)
Neutro Abs: 3.6 10*3/uL (ref 1.4–7.7)
Neutrophils Relative %: 65.1 % (ref 43.0–77.0)
Platelets: 253 10*3/uL (ref 150.0–400.0)
RBC: 4.1 Mil/uL (ref 3.87–5.11)
RDW: 13.1 % (ref 11.5–15.5)
WBC: 5.6 10*3/uL (ref 4.0–10.5)

## 2023-05-20 LAB — COMPREHENSIVE METABOLIC PANEL
ALT: 10 U/L (ref 0–35)
AST: 12 U/L (ref 0–37)
Albumin: 3.8 g/dL (ref 3.5–5.2)
Alkaline Phosphatase: 65 U/L (ref 39–117)
BUN: 12 mg/dL (ref 6–23)
CO2: 26 meq/L (ref 19–32)
Calcium: 8.5 mg/dL (ref 8.4–10.5)
Chloride: 108 meq/L (ref 96–112)
Creatinine, Ser: 0.63 mg/dL (ref 0.40–1.20)
GFR: 104.73 mL/min (ref >60.00)
Glucose, Bld: 84 mg/dL (ref 70–99)
Potassium: 3.7 meq/L (ref 3.5–5.1)
Sodium: 140 meq/L (ref 135–145)
Total Bilirubin: 0.4 mg/dL (ref 0.2–1.2)
Total Protein: 6.6 g/dL (ref 6.0–8.3)

## 2023-05-20 LAB — TSH: TSH: 1.32 u[IU]/mL (ref 0.35–5.50)

## 2023-05-20 LAB — IBC + FERRITIN
Ferritin: 19.3 ng/mL (ref 10.0–291.0)
Iron: 101 ug/dL (ref 42–145)
Saturation Ratios: 33.1 % (ref 20.0–50.0)
TIBC: 305.2 ug/dL (ref 250.0–450.0)
Transferrin: 218 mg/dL (ref 212.0–360.0)

## 2023-05-20 LAB — T3, FREE: T3, Free: 3 pg/mL (ref 2.3–4.2)

## 2023-05-20 LAB — T4, FREE: Free T4: 0.73 ng/dL (ref 0.60–1.60)

## 2023-05-20 NOTE — Patient Instructions (Signed)
It was great to see you!  We will order thyroid ultrasound at [facility] [phone]  We will order bone density scan at  [facility] -- please schedule this with our front desk staff  We will update blood work and urine testing today  Let's follow-up in May for your Comprehensive Physical Exam (CPE) preventive care annual visit, sooner if you have concerns.  Take care,  Jarold Motto PA-C

## 2023-05-23 ENCOUNTER — Inpatient Hospital Stay
Admission: RE | Admit: 2023-05-23 | Discharge: 2023-05-23 | Discharge status: Home / Self Care | Payer: 59 | Source: Ambulatory Visit | Attending: Physician Assistant

## 2023-05-23 DIAGNOSIS — E010 Iodine-deficiency related diffuse (endemic) goiter: Secondary | ICD-10-CM

## 2023-05-26 ENCOUNTER — Other Ambulatory Visit: Payer: Self-pay | Admitting: Physician Assistant

## 2023-05-26 ENCOUNTER — Encounter: Payer: Self-pay | Admitting: Physician Assistant

## 2023-05-26 DIAGNOSIS — E041 Nontoxic single thyroid nodule: Secondary | ICD-10-CM

## 2023-05-27 ENCOUNTER — Ambulatory Visit (INDEPENDENT_AMBULATORY_CARE_PROVIDER_SITE_OTHER)
Admission: RE | Admit: 2023-05-27 | Discharge: 2023-05-27 | Disposition: A | Discharge status: Home / Self Care | Payer: 59 | Source: Ambulatory Visit | Attending: Physician Assistant

## 2023-05-27 DIAGNOSIS — E2839 Other primary ovarian failure: Secondary | ICD-10-CM

## 2023-05-27 DIAGNOSIS — M898X9 Other specified disorders of bone, unspecified site: Secondary | ICD-10-CM | POA: Diagnosis not present

## 2023-06-10 ENCOUNTER — Ambulatory Visit (INDEPENDENT_AMBULATORY_CARE_PROVIDER_SITE_OTHER): Payer: 59 | Admitting: Otolaryngology

## 2023-06-10 ENCOUNTER — Encounter (INDEPENDENT_AMBULATORY_CARE_PROVIDER_SITE_OTHER): Payer: Self-pay | Admitting: Otolaryngology

## 2023-06-10 VITALS — BP 137/82 | HR 81 | Ht 61.0 in | Wt 132.0 lb

## 2023-06-10 DIAGNOSIS — E041 Nontoxic single thyroid nodule: Secondary | ICD-10-CM | POA: Diagnosis not present

## 2023-06-10 NOTE — Progress Notes (Signed)
 ENT CONSULT:  Reason for Consult:  thyroid  nodules neck pressure x 8 months  HPI: Discussed the use of AI scribe software for clinical note transcription with the patient, who gave verbal consent to proceed.  History of Present Illness   The patient is a 48 yoF initially noticed a small bump in their neck approximately eight to nine months ago, which they did not pay much attention to at the time. However, about two weeks prior to the consultation, they noticed that the bump had grown slightly larger while washing their face. This prompted them to seek medical attention from their primary care provider, who referred them for further evaluation. The patient denies any associated pain or discomfort, but notes a slight discomfort when applying pressure to the area. They report no difficulty swallowing or changes in their voice.  The patient had previously seen another healthcare provider in December, who ordered an ultrasound of the thyroid . The results of this ultrasound revealed a thyroid  nodule measuring 3.5 cm in diameter spongiform, no biopsy recommended. The patient was referred for a second opinion regarding the management of this nodule.  The patient expressed concern about the presence of the nodule and inquired about potential causes and treatment options. They were reassured that the nodule appeared benign and did not require immediate intervention.       Records Reviewed:  Office visit with PA Lucie Buttner 05/20/23  Mass       Pt c/o mass at throat area, x several months.    HPI   Mass (Right-side Anterior Throat): Complains of a mass on her neck for the past several months that she noticed recently has grown.  She states that she wasn't initially concerned when she noticed the mass, but about 2 weeks ago while washing her face she felt it and noticed it had grown since she had noticed it.  Notes hx of B12 elevation, panel done 02/04/23 resulted with B12 >2000.  Denies pain,  difficulty swallowing, cough, reflux, heart burn, unintentional weight loss, or night sweats.    Anemia: Managed with an iron  supplement - tolerates well.  Last iron  panel 02/04/23 resulted with 112 iron , 345 TIBC, 32 %SAT, and 9 Ferritin.  Would like her iron  rechecked today.   Bone Aches:  Reports that her knees and arms have been aching and occasionally will experience tingling in her arms.  She would like to have a Bone Density Scan done.  Her Vitamin-D from 02/04/23 was 30. Denies fhx of osteoporosis or hx of fractures.   Thyromegaly Questionable enlarging thyroid  on exam  Will order ultrasound and thyroid  testing   Iron  deficiency anemia due to chronic blood loss Update iron  panel and provide recommendations accordingly   Bone pain; Estrogen deficiency Unclear etiology Will order blood work and UA for further evaluation Will order bone density per patient request   I,Emily Lagle,acting as a scribe for [employer], PA.,have documented all relevant documentation on the behalf of Lucie Buttner, PA,as directed by  Lucie Buttner, PA while in the presence of Lucie Buttner, [place].     Past Medical History:  Diagnosis Date   Abnormal Pap smear    HPV   Acute blood loss anemia 04/30/2012   Blood type O+    PONV (postoperative nausea and vomiting)    SAB (spontaneous abortion)    Uterine fibroid 04/06/2011   Submucus     Past Surgical History:  Procedure Laterality Date   CESAREAN SECTION  04/29/2012   Procedure: CESAREAN  SECTION;  Surgeon: Robbi JONELLE Render, MD;  Location: [facility];  Service: Obstetrics;  Laterality: N/A;   CESAREAN SECTION N/A 06/27/2015   Procedure: Repeat CESAREAN SECTION;  Surgeon: Burnard Bowers, MD;  Location: [facility];  Service: Obstetrics;  Laterality: N/A;  EDD: 06/30/15    Family History  Problem Relation Age of Onset   Hypertension Mother    Thyroid  disease Mother    Heart disease Father 66       Died age 47   Heart disease Brother 32        Big Heart    Other Neg Hx    Cancer Neg Hx    Colon cancer Neg Hx    Cancer - Colon Neg Hx     Social History:  reports that she has never smoked. She has never used smokeless tobacco. She reports current alcohol use. She reports that she does not use drugs.  Allergies: No Known Allergies  Medications: I have reviewed the patient's current medications.  The PMH, PSH, Medications, Allergies, and SH were reviewed and updated.  ROS: Constitutional: Negative for fever, weight loss and weight gain. Cardiovascular: Negative for chest pain and dyspnea on exertion. Respiratory: Is not experiencing shortness of breath at rest. Gastrointestinal: Negative for nausea and vomiting. Neurological: Negative for headaches. Psychiatric: The patient is not nervous/anxious  Blood pressure 137/82, pulse 81, height 5' 1 (1.549 m), weight 132 lb (59.9 kg), last menstrual period 05/27/2023, SpO2 99%.  PHYSICAL EXAM:  Exam: General: Well-developed, well-nourished Communication and Voice: Clear pitch and clarity Respiratory Respiratory effort: Equal inspiration and expiration without stridor Cardiovascular Peripheral Vascular: Warm extremities with equal color/perfusion Eyes: No nystagmus with equal extraocular motion bilaterally Neuro/Psych/Balance: Patient oriented to person, place, and time; Appropriate mood and affect; Gait is intact with no imbalance; Cranial nerves I-XII are intact Head and Face Inspection: Normocephalic and atraumatic without mass or lesion Palpation: Facial skeleton intact without bony stepoffs Salivary Glands: No mass or tenderness Facial Strength: Facial motility symmetric and full bilaterally ENT Pinna: External ear intact and fully developed External canal: Canal is patent with intact skin Tympanic Membrane: Clear and mobile External Nose: No scar or anatomic deformity Internal Nose: Septum is relatively straight on anterior rhinoscopy.  Lips, Teeth, and gums:  Mucosa and teeth intact and viable TMJ: No pain to palpation with full mobility Oral cavity/oropharynx: No erythema or exudate, no lesions present Neck Neck and Trachea: Midline trachea without mass or lesion Thyroid : palpable 2 cm nodule right thyroid  Lymphatics: No lymphadenopathy  Procedure: none  Studies Reviewed: Thyroid  U/S 05/23/23 FINDINGS: Parenchymal Echotexture: Normal   Isthmus: 0.4 cm   Right lobe: 4.3 x 1.9 x 2.7 cm   Left lobe: 3.9 x 1.0 x 1.5 cm   _________________________________________________________   Estimated total number of nodules >/= 1 cm: 1   Number of spongiform nodules >/=  2 cm not described below (TR1): 0   Number of mixed cystic and solid nodules >/= 1.5 cm not described below (TR2): 0   _________________________________________________________   Nodule # 1: Spongiform nodule in the right mid gland measures 3.4 x 1.6 x 2.1 cm. This is a sonographically low risk/benign morphology. This nodule does NOT meet TI-RADS criteria for biopsy or dedicated follow-up.   The thyroid  gland is otherwise normal in appearance. No additional nodules.   IMPRESSION: The palpable abnormality in the right thyroid  gland corresponds with a 3.4 cm sonographically benign/low risk spongiform thyroid  nodule.   This nodule does NOT meet TI-RADS  criteria for biopsy or dedicated follow-up.    Assessment/Plan: Encounter Diagnosis  Name Primary?   Thyroid  nodule Yes    Assessment and Plan    Thyroid  Nodule Thyroid  nodule identified on ultrasound, measuring 3.4 cm, classified as spongiform. No compressive symptoms, pain, dysphagia, or dysphonia reported. No indication for biopsy or thyroidectomy at this time. Explained that intervention is only necessary for compressive symptoms or suspicious nodules. Reassured that the nodule appears benign and no immediate action is needed unless future changes occur. - Repeat thyroid  ultrasound in 6-12 months - Follow up  with primary care physician for repeat ultrasound     Thank you for allowing me to participate in the care of this patient. Please do not hesitate to contact me with any questions or concerns.   Elena Larry, MD Otolaryngology [facility] Phone: [phone] Fax: [phone]    06/10/2023, 11:42 AM

## 2023-07-28 ENCOUNTER — Encounter: Payer: Self-pay | Admitting: Physician Assistant

## 2023-10-05 ENCOUNTER — Encounter: Payer: 59 | Admitting: Physician Assistant

## 2023-10-07 ENCOUNTER — Ambulatory Visit (INDEPENDENT_AMBULATORY_CARE_PROVIDER_SITE_OTHER): Payer: 59 | Admitting: Physician Assistant

## 2023-10-07 ENCOUNTER — Encounter: Payer: Self-pay | Admitting: Physician Assistant

## 2023-10-07 VITALS — BP 106/78 | HR 67 | Temp 97.9°F | Ht 61.0 in | Wt 137.8 lb

## 2023-10-07 DIAGNOSIS — Z0001 Encounter for general adult medical examination with abnormal findings: Secondary | ICD-10-CM | POA: Diagnosis not present

## 2023-10-07 DIAGNOSIS — D500 Iron deficiency anemia secondary to blood loss (chronic): Secondary | ICD-10-CM

## 2023-10-07 DIAGNOSIS — E559 Vitamin D deficiency, unspecified: Secondary | ICD-10-CM

## 2023-10-07 DIAGNOSIS — E041 Nontoxic single thyroid nodule: Secondary | ICD-10-CM | POA: Diagnosis not present

## 2023-10-07 DIAGNOSIS — R202 Paresthesia of skin: Secondary | ICD-10-CM

## 2023-10-07 DIAGNOSIS — R109 Unspecified abdominal pain: Secondary | ICD-10-CM | POA: Diagnosis not present

## 2023-10-07 DIAGNOSIS — R7989 Other specified abnormal findings of blood chemistry: Secondary | ICD-10-CM

## 2023-10-07 LAB — COMPREHENSIVE METABOLIC PANEL WITH GFR
ALT: 11 U/L (ref 0–35)
AST: 14 U/L (ref 0–37)
Albumin: 4.2 g/dL (ref 3.5–5.2)
Alkaline Phosphatase: 72 U/L (ref 39–117)
BUN: 15 mg/dL (ref 6–23)
CO2: 26 meq/L (ref 19–32)
Calcium: 8.7 mg/dL (ref 8.4–10.5)
Chloride: 108 meq/L (ref 96–112)
Creatinine, Ser: 0.64 mg/dL (ref 0.40–1.20)
GFR: 104.05 mL/min (ref >60.00)
Glucose, Bld: 93 mg/dL (ref 70–99)
Potassium: 4.3 meq/L (ref 3.5–5.1)
Sodium: 139 meq/L (ref 135–145)
Total Bilirubin: 0.4 mg/dL (ref 0.2–1.2)
Total Protein: 6.9 g/dL (ref 6.0–8.3)

## 2023-10-07 LAB — CBC WITH DIFFERENTIAL/PLATELET
Basophils Absolute: 0 10*3/uL (ref 0.0–0.1)
Basophils Relative: 0.4 % (ref 0.0–3.0)
Eosinophils Absolute: 0.1 10*3/uL (ref 0.0–0.7)
Eosinophils Relative: 1.5 % (ref 0.0–5.0)
HCT: 38.1 % (ref 36.0–46.0)
Hemoglobin: 12.5 g/dL (ref 12.0–15.0)
Lymphocytes Relative: 35.3 % (ref 12.0–46.0)
Lymphs Abs: 1.7 10*3/uL (ref 0.7–4.0)
MCHC: 32.7 g/dL (ref 30.0–36.0)
MCV: 92.2 fl (ref 78.0–100.0)
Monocytes Absolute: 0.3 10*3/uL (ref 0.1–1.0)
Monocytes Relative: 6.8 % (ref 3.0–12.0)
Neutro Abs: 2.8 10*3/uL (ref 1.4–7.7)
Neutrophils Relative %: 56 % (ref 43.0–77.0)
Platelets: 279 10*3/uL (ref 150.0–400.0)
RBC: 4.13 Mil/uL (ref 3.87–5.11)
RDW: 13.4 % (ref 11.5–15.5)
WBC: 4.9 10*3/uL (ref 4.0–10.5)

## 2023-10-07 LAB — VITAMIN D 25 HYDROXY (VIT D DEFICIENCY, FRACTURES): VITD: 23.3 ng/mL — ABNORMAL LOW (ref 30.00–100.00)

## 2023-10-07 LAB — LIPID PANEL
Cholesterol: 156 mg/dL (ref 0–200)
HDL: 44.4 mg/dL (ref >39.00)
LDL Cholesterol: 102 mg/dL — ABNORMAL HIGH (ref 0–99)
NonHDL: 111.7
Total CHOL/HDL Ratio: 4
Triglycerides: 49 mg/dL (ref 0.0–149.0)
VLDL: 9.8 mg/dL (ref 0.0–40.0)

## 2023-10-07 LAB — TSH: TSH: 1.78 u[IU]/mL (ref 0.35–5.50)

## 2023-10-07 LAB — HEMOGLOBIN A1C: Hgb A1c MFr Bld: 5.4 % (ref 4.6–6.5)

## 2023-10-07 LAB — VITAMIN B12: Vitamin B-12: >1500 pg/mL — ABNORMAL HIGH (ref 211–911)

## 2023-10-07 NOTE — Patient Instructions (Signed)
 It was great to see you!  To treat your constipation today: -Drink at least 64 oz of water daily -Add in 1/2 capful of polyethylene glycol (also known as Miralax, however generic is fine!) to beverage of choice daily  Referral to neurology  Repeat thyroid  ultrasound   If still no results, please call the office   Please go to the lab for blood work.   Our office will call you with your results unless you have chosen to receive results via MyChart.  If your blood work is normal we will follow-up each year for physicals and as scheduled for chronic medical problems.  If anything is abnormal we will treat accordingly and get you in for a follow-up.  Take care,  Tracy Bradshaw

## 2023-10-07 NOTE — Progress Notes (Signed)
 Subjective:    Tracy Bradshaw is a 48 y.o. female and is here for a comprehensive physical exam.  HPI  Health Maintenance Due  Topic Date Due   Cervical Cancer Screening (Pap smear)  03/10/2023    Acute Concerns: Abdominal Cramps Patient state that she occasionally experiences abdominal cramps that started after her colonoscopy Theses symptoms occur after occur in her LLQ region and tend to occur after eating specific food.  She also endorses occasional constipation.   Tingling  Patient also complains of a tingling and a numbing sensation that start in her right shoulder and radiate to her arm and hands. Sensation would occasionally radiate to her right leg as well. No symptoms in her left side. Symptoms tends to last for few minutes. These episodes tend to occur once every 1-2 months.  She denies any accompanying headaches. Agreeable to a neurology referral.   Chronic Issues: Thyroid  Nodule  Previous imaging showed that nodule did not meet criteria for reimaging or biopsy.  She met up with an ENT specialist who recommended to follow up in 6 months. She is interested in doing this.  She denies any pain or associated discomfort but states that she notices nodule when turning her head or when drinking water.  Patient states that he mother has been recently diagnosed with a thyroid  issue recently, can't recall the specific issue.  Will check thyroid  levels today along with LFTs and vitamin B12 levels.   Health Maintenance: Immunizations -- N/A Colonoscopy -- Last done on 02/11/23. Recall in 3 years.   Mammogram -- last done on 03/09/23. PAP -- UTD request records.  Bone Density -- N/A Diet -- attempts to follow a healthy diet.  Exercise -- occasional walks   Sleep habits -- no complaints Mood -- stable   UTD with dentist? - yes UTD with eye doctor? - no  Weight history: Wt Readings from Last 10 Encounters:  10/07/23 137 lb 12.8 oz (62.5 kg)  06/10/23 132 lb (59.9 kg)   05/20/23 135 lb (61.2 kg)  02/22/23 132 lb 4 oz (60 kg)  02/11/23 134 lb (60.8 kg)  02/04/23 132 lb (59.9 kg)  01/21/23 134 lb (60.8 kg)  01/03/23 134 lb 4 oz (60.9 kg)  09/27/22 132 lb (59.9 kg)  07/06/22 132 lb (59.9 kg)   Body mass index is 26.04 kg/m. No LMP recorded.  Alcohol use:  reports current alcohol use.  Tobacco use:  Tobacco Use: Low Risk  (06/10/2023)   Patient History    Smoking Tobacco Use: Never    Smokeless Tobacco Use: Never    Passive Exposure: Not on file   Eligible for lung cancer screening? no     10/07/2023    8:11 AM  Depression screen PHQ 2/9  Decreased Interest 0  Down, Depressed, Hopeless 0  PHQ - 2 Score 0     Other providers/specialists: Patient Care Team: Alexander Iba, [place] as PCP - General (Physician Assistant) Obgyn, Corbin Dess as Consulting Physician    PMHx, SurgHx, SocialHx, Medications, and Allergies were reviewed in the Visit Navigator and updated as appropriate.   Past Medical History:  Diagnosis Date   Abnormal Pap smear    HPV   Acute blood loss anemia 04/30/2012   Blood type O+    PONV (postoperative nausea and vomiting)    SAB (spontaneous abortion)    Uterine fibroid 04/06/2011   Submucus      Past Surgical History:  Procedure Laterality Date   CESAREAN SECTION  04/29/2012   Procedure: CESAREAN SECTION;  Surgeon: Shasta Deist, MD;  Location: [facility];  Service: Obstetrics;  Laterality: N/A;   CESAREAN SECTION N/A 06/27/2015   Procedure: Repeat CESAREAN SECTION;  Surgeon: Audelia Leaks, MD;  Location: [facility];  Service: Obstetrics;  Laterality: N/A;  EDD: 06/30/15     Family History  Problem Relation Age of Onset   Hypertension Mother    Thyroid  disease Mother    Heart disease Father 94       Died age 52   Heart disease Brother 69       "Big Heart"    Other Neg Hx    Cancer Neg Hx    Colon cancer Neg Hx    Cancer - Colon Neg Hx     Social History   Tobacco Use   Smoking status: Never   Smokeless  tobacco: Never  Substance Use Topics   Alcohol use: Yes    Comment: very rare   Drug use: No    Review of Systems:   Review of Systems  Constitutional:  Negative for chills, fever, malaise/fatigue and weight loss.  HENT:  Negative for hearing loss, sinus pain and sore throat.   Respiratory:  Negative for cough and hemoptysis.   Cardiovascular:  Negative for chest pain, palpitations, leg swelling and PND.  Gastrointestinal:  Positive for constipation. Negative for abdominal pain, diarrhea, heartburn, nausea and vomiting.  Genitourinary:  Negative for dysuria, frequency and urgency.  Musculoskeletal:  Negative for back pain, myalgias and neck pain.  Skin:  Negative for itching and rash.  Neurological:  Positive for tingling. Negative for dizziness, seizures and headaches.  Endo/Heme/Allergies:  Negative for polydipsia.  Psychiatric/Behavioral:  Negative for depression. The patient is not nervous/anxious.     Objective:   BP 106/78 (BP Location: Left Arm, Patient Position: Sitting, Cuff Size: Normal)   Pulse 67   Temp 97.9 F (36.6 C) (Temporal)   Ht 5\' 1"  (1.549 m)   Wt 137 lb 12.8 oz (62.5 kg)   SpO2 99%   BMI 26.04 kg/m  Body mass index is 26.04 kg/m.   General Appearance:    Alert, cooperative, no distress, appears stated age  Head:    Normocephalic, without obvious abnormality, atraumatic  Eyes:    PERRL, conjunctiva/corneas clear, EOM's intact, fundi    benign, both eyes  Ears:    Normal TM's and external ear canals, both ears  Nose:   Nares normal, septum midline, mucosa normal, no drainage    or sinus tenderness  Throat:   Lips, mucosa, and tongue normal; teeth and gums normal  Neck:   Supple, symmetrical, trachea midline, no adenopathy;    thyroid :  no enlargement/tenderness; palpable right thyroid  nodule; no carotid   bruit or JVD  Back:     Symmetric, no curvature, ROM normal, no CVA tenderness  Lungs:     Clear to auscultation bilaterally, respirations  unlabored  Chest Wall:    No tenderness or deformity   Heart:    Regular rate and rhythm, S1 and S2 normal, no murmur, rub or gallop  Breast Exam:    Deferred  Abdomen:     Soft, non-tender, bowel sounds active all four quadrants,    no masses, no organomegaly  Genitalia:    Deferred  Extremities:   Extremities normal, atraumatic, no cyanosis or edema  Pulses:   2+ and symmetric all extremities  Skin:   Skin color, texture, turgor normal, no rashes or lesions  Lymph nodes:   Cervical, supraclavicular, and axillary nodes normal  Neurologic:   CNII-XII intact, normal strength, sensation and reflexes    throughout    Assessment/Plan:   Encounter for general adult medical examination with abnormal findings Today patient counseled on age appropriate routine health concerns for screening and prevention, each reviewed and up to date or declined. Immunizations reviewed and up to date or declined. Labs ordered and reviewed. Risk factors for depression reviewed and negative. Hearing function and visual acuity are intact. ADLs screened and addressed as needed. Functional ability and level of safety reviewed and appropriate. Education, counseling and referrals performed based on assessed risks today. Patient provided with a copy of personalized plan for preventive services.  Paresthesias Unclear etiology Will update blood work Refer to neurology If symptom(s) worsen in the meantime, consider ordering STAT imaging or changing neurology referral to stat   Abdominal cramping No red flags Recommend treating constipation with 1/2 capful miralax daily If symptom(s) persist, likely refer to gastroenterology   Thyroid  nodule Patient requesting repeat imaging -- will order  Iron  deficiency anemia due to chronic blood loss Update blood work and provide recommendations   Elevated vitamin B12 level Update blood work and provide recommendations   Vitamin D  deficiency Update blood work and provide  recommendations    Alexander Iba, PA-C [facility]   I,Safa M Engineer, manufacturing systems as a Neurosurgeon for [employer], PA.,have documented all relevant documentation on the behalf of Alexander Iba, PA,as directed by  Alexander Iba, PA while in the presence of Alexander Iba, [place].   I, Alexander Iba, [place], have reviewed all documentation for this visit. The documentation on 10/07/23 for the exam, diagnosis, procedures, and orders are all accurate and complete.

## 2023-10-09 ENCOUNTER — Encounter: Payer: Self-pay | Admitting: Physician Assistant

## 2023-10-21 ENCOUNTER — Ambulatory Visit: Payer: Self-pay | Admitting: Physician Assistant

## 2023-10-21 DIAGNOSIS — E041 Nontoxic single thyroid nodule: Secondary | ICD-10-CM

## 2023-11-07 ENCOUNTER — Ambulatory Visit
Admission: RE | Admit: 2023-11-07 | Discharge: 2023-11-07 | Disposition: A | Discharge status: Home / Self Care | Source: Ambulatory Visit | Attending: Physician Assistant

## 2023-11-07 DIAGNOSIS — E041 Nontoxic single thyroid nodule: Secondary | ICD-10-CM

## 2023-11-18 ENCOUNTER — Telehealth: Payer: Self-pay | Admitting: *Deleted

## 2023-11-18 NOTE — Telephone Encounter (Signed)
 Copied from CRM [id number]. Topic: General - Other >> Nov 17, 2023  4:29 PM Trula Gable C wrote: Reason for CRM: Patient called in stated she has a dog and wanted to know what is the steps so she could register them as an emotional support , would like for PA Alexander Iba or nurses to give a callback regarding this

## 2023-11-18 NOTE — Telephone Encounter (Signed)
 Spoke to pt told her [employer] does not do letters for emotional support animals. She said you can contact [employer], phone number given [phone]. Pt verbalized understanding.

## 2023-12-30 ENCOUNTER — Ambulatory Visit: Admitting: Neurology

## 2023-12-30 ENCOUNTER — Encounter: Payer: Self-pay | Admitting: Neurology

## 2023-12-30 VITALS — BP 128/79 | HR 83 | Ht 61.0 in | Wt 136.0 lb

## 2023-12-30 DIAGNOSIS — R202 Paresthesia of skin: Secondary | ICD-10-CM

## 2023-12-30 DIAGNOSIS — E041 Nontoxic single thyroid nodule: Secondary | ICD-10-CM | POA: Insufficient documentation

## 2023-12-30 NOTE — Progress Notes (Signed)
 Chief Complaint  Patient presents with   RM14/PARESTHESIA    Pt is here Alone. Pt states that she has some numbness on the side of her right face that comes and goes. Pt states that her numbness will descend down to her arm as well as her leg on her right side. Pt states that she has some tingling sensations. Pt states that her right eye will sometimes feel like something is pinching behind her eye.       ASSESSMENT AND PLAN  Tracy Bradshaw is a 48 y.o. female   Intermittent right hemiparesthesia,  Overall stable symptoms,  Normal neurological examination,  I offered MRI of the brain, she wants to observe, if decide to proceed will contact office  DIAGNOSTIC DATA (LABS, IMAGING, TESTING) - I reviewed patient records, labs, notes, testing and imaging myself where available.   MEDICAL HISTORY:  Tracy Bradshaw is a 48 year old female, seen in request by her primary care PA   Job Lukes, for intermittent right side paresthesia, initial evaluation was on December 30, 2023  History is obtained from the patient and review of electronic medical records. I personally reviewed pertinent available imaging films in PACS.   PMHx of  Chronic anemia   Around 2022, cleaning toilet, she fell, bumped her right parietal region on a hard surface, there was no significant skin abrasion, but she had transient flushing sensation in her scalp, as if the blood was dripping, ever since that initial episode, she had intermittent numbness involving right face, arm, leg, happened couple times a month, it will stay for 1 or 2 hours, she denies headache, visual change, there was no significant worsening or improvement over the years    PHYSICAL EXAM:   Vitals:   12/30/23 1028  BP: 128/79  Pulse: 83  SpO2: 99%  Weight: 136 lb (61.7 kg)  Height: 5' 1 (1.549 m)     Body mass index is 25.7 kg/m.  PHYSICAL EXAMNIATION:  Gen: NAD, conversant, well nourised, well groomed                      Cardiovascular: Regular rate rhythm, no peripheral edema, warm, nontender. Eyes: Conjunctivae clear without exudates or hemorrhage Neck: Supple, no carotid bruits. Pulmonary: Clear to auscultation bilaterally   NEUROLOGICAL EXAM:  MENTAL STATUS: Speech/cognition: Awake, alert, oriented to history taking and casual conversation CRANIAL NERVES: CN II: Visual fields are full to confrontation. Pupils are round equal and briskly reactive to light. CN III, IV, VI: extraocular movement are normal. No ptosis. CN V: Facial sensation is intact to light touch CN VII: Face is symmetric with normal eye closure  CN VIII: Hearing is normal to causal conversation. CN IX, X: Phonation is normal. CN XI: Head turning and shoulder shrug are intact  MOTOR: There is no pronator drift of out-stretched arms. Muscle bulk and tone are normal. Muscle strength is normal.  REFLEXES: Reflexes are 2+ and symmetric at the biceps, triceps, knees, and ankles. Plantar responses are flexor.  SENSORY: Intact to light touch, pinprick and vibratory sensation are intact in fingers and toes.  COORDINATION: There is no trunk or limb dysmetria noted.  GAIT/STANCE: Posture is normal. Gait is steady with normal steps, base, arm swing, and turning. Heel and toe walking are normal. Tandem gait is normal.  Romberg is absent.  REVIEW OF SYSTEMS:  Full 14 system review of systems performed and notable only for as above All other review of systems were negative.  ALLERGIES: No Known Allergies  HOME MEDICATIONS: Current Outpatient Medications  Medication Sig Dispense Refill   acetaminophen  (TYLENOL ) 325 MG tablet Take 2 tablets (650 mg total) by mouth every 4 (four) hours as needed (for pain scale < 4).     albuterol (PROVENTIL) (2.5 MG/3ML) 0.083% nebulizer solution Take 2.5 mg by nebulization every 6 (six) hours as needed.     calcium carbonate (OS-CAL) 1250 (500 Ca) MG chewable tablet Chew 1 tablet by mouth daily.      cholecalciferol (VITAMIN D3) 25 MCG (1000 UNIT) tablet Take 1,000 Units by mouth daily.     ibuprofen  (ADVIL ) 600 MG tablet Take 600 mg by mouth as needed.     Magnesium 300 MG CAPS 1 capsule with a meal Orally Once a day     Magnesium Ascorbate POWD 0.5 Scoops by Does not apply route daily in the afternoon.     Omega-3 Fatty Acids (FISH OIL) 300 MG CAPS Take by mouth.     Oyster Shell Calcium 500 MG TABS 1 tablet with meals Orally Twice a day     SALINE NASAL MIST NA Place into the nose as needed.     Multiple Vitamins-Minerals (MULTIVITAMIN WITH MINERALS) tablet Take 1 tablet by mouth daily. (Patient not taking: Reported on 12/30/2023)     No current facility-administered medications for this visit.    PAST MEDICAL HISTORY: Past Medical History:  Diagnosis Date   Abnormal Pap smear    HPV   Acute blood loss anemia 04/30/2012   Blood type O+    PONV (postoperative nausea and vomiting)    SAB (spontaneous abortion)    Uterine fibroid 04/06/2011   Submucus     PAST SURGICAL HISTORY: Past Surgical History:  Procedure Laterality Date   CESAREAN SECTION  04/29/2012   Procedure: CESAREAN SECTION;  Surgeon: Robbi JONELLE Render, MD;  Location: [facility];  Service: Obstetrics;  Laterality: N/A;   CESAREAN SECTION N/A 06/27/2015   Procedure: Repeat CESAREAN SECTION;  Surgeon: Burnard Bowers, MD;  Location: [facility];  Service: Obstetrics;  Laterality: N/A;  EDD: 06/30/15    FAMILY HISTORY: Family History  Problem Relation Age of Onset   Hypertension Mother    Thyroid  disease Mother    Heart disease Father 62       Died age 21   Heart disease Brother 87       Big Heart    Other Neg Hx    Cancer Neg Hx    Colon cancer Neg Hx    Cancer - Colon Neg Hx     SOCIAL HISTORY: Social History   Socioeconomic History   Marital status: Married    Spouse name: Not on file   Number of children: Not on file   Years of education: Not on file   Highest education level: Not on file  Occupational  History   Not on file  Tobacco Use   Smoking status: Never   Smokeless tobacco: Never  Vaping Use   Vaping status: Not on file  Substance and Sexual Activity   Alcohol use: Yes    Comment: very rare   Drug use: No   Sexual activity: Not on file  Other Topics Concern   Not on file  Social History Narrative   Married   Two children   Stays at home   Social Drivers of Health   Financial Resource Strain: Not on file  Food Insecurity: Not on file  Transportation Needs: Not on file  Physical Activity: Not on file  Stress: Not on file  Social Connections: Unknown (04/19/2022)   [facility]   Social Network    Social Network: Not on file  Intimate Partner Violence: Unknown (04/19/2022)   Received from [facility]   HITS    Physically Hurt: Not on file    Insult or Talk Down To: Not on file    Threaten Physical Harm: Not on file    Scream or Curse: Not on file      Modena Callander, M.D. Ph.D.  [facility] [address], [address] [place], [place] [address] Ph: [phone] Fax: [phone]  CC:  Job Lukes, [place] [address] [place],  [place] [address]  Job Lukes, [place]

## 2024-02-02 ENCOUNTER — Ambulatory Visit: Payer: Self-pay | Admitting: *Deleted

## 2024-02-02 NOTE — Telephone Encounter (Signed)
 FYI Only or Action Required?: FYI only for provider.  Patient was last seen in primary care on 10/07/2023 by Tracy Lukes, PA.  Called Nurse Triage reporting Abdominal Pain.  Symptoms began several days ago.  Interventions attempted: Rest, hydration, or home remedies.  Symptoms are: gradually worsening.  Triage Disposition: See Physician Within 24 Hours  Patient/caregiver understands and will follow disposition?: Yes  Please advise if any appt available after lunch today or tomorrow with PCP only. Appt scheduled for 02/06/24 with PCP.           Copied from CRM [id number]. Topic: Clinical - Red Word Triage >> Feb 02, 2024  9:29 AM Deaijah H wrote: Red Word that prompted transfer to Nurse Triage: Pain in upper stomach.(Currently) Reason for Disposition  [1] MILD pain (e.g., does not interfere with normal activities) AND [2] comes and goes (cramps) AND [3] present > 72 hours  (Exception: This same abdominal pain is a chronic symptom recurrent or ongoing AND present > 4 weeks.)  Answer Assessment - Initial Assessment Questions Appt scheduled for 02/06/24. Patient requesting appt this afternoon or tomorrow afternoon with PCP only. None available . Recommended if sx worsen go to UC/ED if needed. Please advise if PCP can see patient after lunch.        1. LOCATION: Where does it hurt?      Upper  2. RADIATION: Does the pain shoot anywhere else? (e.g., chest, back)     no 3. ONSET: When did the pain begin? (e.g., minutes, hours or days ago)      Few days  4. SUDDEN: Gradual or sudden onset?     Gradual  5. PATTERN Does the pain come and go, or is it constant?     Pressure in stomach feels pain comes and goes 6. SEVERITY: How bad is the pain?  (e.g., Scale 1-10; mild, moderate, or severe)     Moderate pain with kneeling  7. RECURRENT SYMPTOM: Have you ever had this type of stomach pain before? If Yes, ask: When was the last time? and What happened that time?       na 8. AGGRAVATING FACTORS: Does anything seem to cause this pain? (e.g., foods, stress, alcohol)   When bending forward kneeling  9. CARDIAC SYMPTOMS: Do you have any of the following symptoms: chest pain, difficulty breathing, sweating, nausea?     Na  10. OTHER SYMPTOMS: Do you have any other symptoms? (e.g., back pain, diarrhea, fever, urination pain, vomiting)       Upper abdominal cramping pain at times.  11. PREGNANCY: Is there any chance you are pregnant? When was your last menstrual period?       na  Protocols used: Abdominal Pain - Upper-A-AH

## 2024-02-02 NOTE — Telephone Encounter (Signed)
Please see triage note and advise.  ?

## 2024-02-03 ENCOUNTER — Ambulatory Visit: Admitting: Physician Assistant

## 2024-02-03 ENCOUNTER — Encounter: Payer: Self-pay | Admitting: Physician Assistant

## 2024-02-03 VITALS — BP 124/80 | HR 79 | Temp 97.9°F | Ht 61.0 in | Wt 136.0 lb

## 2024-02-03 DIAGNOSIS — R140 Abdominal distension (gaseous): Secondary | ICD-10-CM | POA: Diagnosis not present

## 2024-02-03 DIAGNOSIS — R1010 Upper abdominal pain, unspecified: Secondary | ICD-10-CM

## 2024-02-03 DIAGNOSIS — Z23 Encounter for immunization: Secondary | ICD-10-CM | POA: Diagnosis not present

## 2024-02-03 NOTE — Patient Instructions (Signed)
 VISIT SUMMARY: Today, you were seen for upper abdominal pain that has been present for three days and has improved but still persists. We discussed potential causes and recommended some tests and treatments to help manage your symptoms.  YOUR PLAN: ABDOMINAL PAIN AND BLOATING: You have been experiencing intermittent upper abdominal pain and bloating. Possible causes include gastritis, constipation, an ulcer, or an H. pylori infection. -Take over-the-counter Prilosec or Nexium to reduce stomach acid. -We will conduct blood tests to check your complete blood count, liver and kidney function, and lipase levels. -You will have an H. pylori breath test to check for infection. -Consider using Miralax if constipation is suspected. -Seek emergency care if your pain worsens or if you develop symptoms of appendicitis.                       Contains text generated by Abridge.                                 Contains text generated by Abridge.

## 2024-02-03 NOTE — Progress Notes (Signed)
 Tracy Bradshaw is a 48 y.o. female here for a new problem.  History of Present Illness:   Chief Complaint  Patient presents with   Abdominal Pain    PT c/o abdominal pain at mid abdomen area, umbilicus area x 4 days.    Discussed the use of AI scribe software for clinical note transcription with the patient, who gave verbal consent to proceed.  History of Present Illness Tracy Bradshaw is a 48 year old female who presents with upper abdominal pain.  She has experienced upper abdominal pain for three days, which has improved but persists. The pain radiates to the umbilical region, is intermittent, and worsens with bending. There is no association with eating, nausea, diarrhea, or heartburn. A 'pinch' sensation in the same area has been present since a colonoscopy a year ago, with varying intensity. She has not used medication for the pain and reports no recent dietary or supplement changes.  Bowel movements occur twice daily and are normal, though she sometimes strains. There is no blood in the stool, constipation, nausea, diarrhea, heartburn, urinary symptoms, or recent illness exposure. Menstrual cycles are unchanged, but she feels bloated and sometimes appears distended.  She regularly takes iron  with vitamin C without stomach issues and consumes hot salsa. Her children have recently started back to school, where some children are sick.    Past Medical History:  Diagnosis Date   Abnormal Pap smear    HPV   Acute blood loss anemia 04/30/2012   Blood type O+    PONV (postoperative nausea and vomiting)    SAB (spontaneous abortion)    Uterine fibroid 04/06/2011   Submucus      Social History   Tobacco Use   Smoking status: Never   Smokeless tobacco: Never  Substance Use Topics   Alcohol use: Yes    Comment: very rare   Drug use: No    Past Surgical History:  Procedure Laterality Date   CESAREAN SECTION  04/29/2012   Procedure: CESAREAN SECTION;  Surgeon: Robbi JONELLE Render, MD;   Location: [facility];  Service: Obstetrics;  Laterality: N/A;   CESAREAN SECTION N/A 06/27/2015   Procedure: Repeat CESAREAN SECTION;  Surgeon: Burnard Bowers, MD;  Location: [facility];  Service: Obstetrics;  Laterality: N/A;  EDD: 06/30/15    Family History  Problem Relation Age of Onset   Hypertension Mother    Thyroid  disease Mother    Heart disease Father 53       Died age 21   Heart disease Brother 4       Big Heart    Other Neg Hx    Cancer Neg Hx    Colon cancer Neg Hx    Cancer - Colon Neg Hx     No Known Allergies  Current Medications:   Current Outpatient Medications:    acetaminophen  (TYLENOL ) 325 MG tablet, Take 2 tablets (650 mg total) by mouth every 4 (four) hours as needed (for pain scale < 4)., Disp: , Rfl:    albuterol (PROVENTIL) (2.5 MG/3ML) 0.083% nebulizer solution, Take 2.5 mg by nebulization every 6 (six) hours as needed., Disp: , Rfl:    calcium carbonate (OS-CAL) 1250 (500 Ca) MG chewable tablet, Chew 1 tablet by mouth daily., Disp: , Rfl:    cholecalciferol (VITAMIN D3) 25 MCG (1000 UNIT) tablet, Take 1,000 Units by mouth daily., Disp: , Rfl:    ibuprofen  (ADVIL ) 600 MG tablet, Take 600 mg by mouth as needed., Disp: , Rfl:  Magnesium Ascorbate POWD, 0.5 Scoops by Does not apply route daily in the afternoon., Disp: , Rfl:    Multiple Vitamins-Minerals (MULTIVITAMIN WITH MINERALS) tablet, Take 1 tablet by mouth daily., Disp: , Rfl:    Omega-3 Fatty Acids (FISH OIL) 300 MG CAPS, Take by mouth., Disp: , Rfl:    Oyster Shell Calcium 500 MG TABS, 1 tablet with meals Orally Twice a day, Disp: , Rfl:    SALINE NASAL MIST NA, Place into the nose as needed., Disp: , Rfl:    Review of Systems:   Negative unless otherwise specified per HPI.  Vitals:   Vitals:   02/03/24 1502  BP: 124/80  Pulse: 79  Temp: 97.9 F (36.6 C)  TempSrc: Temporal  SpO2: 99%  Weight: 136 lb (61.7 kg)  Height: 5' 1 (1.549 m)     Body mass index is 25.7 kg/m.  Physical Exam:    Physical Exam Vitals and nursing note reviewed.  Constitutional:      General: She is not in acute distress.    Appearance: She is well-developed. She is not ill-appearing or toxic-appearing.  Cardiovascular:     Rate and Rhythm: Normal rate and regular rhythm.     Pulses: Normal pulses.     Heart sounds: Normal heart sounds, S1 normal and S2 normal.  Pulmonary:     Effort: Pulmonary effort is normal.     Breath sounds: Normal breath sounds.  Abdominal:     General: Abdomen is flat. Bowel sounds are normal.     Palpations: Abdomen is soft.     Tenderness: There is abdominal tenderness in the epigastric area and periumbilical area. There is no right CVA tenderness, left CVA tenderness, guarding or rebound.  Skin:    General: Skin is warm and dry.  Neurological:     Mental Status: She is alert.     GCS: GCS eye subscore is 4. GCS verbal subscore is 5. GCS motor subscore is 6.  Psychiatric:        Speech: Speech normal.        Behavior: Behavior normal. Behavior is cooperative.     Assessment and Plan:   Assessment and Plan Assessment & Plan Abdominal pain and bloating Intermittent upper abdominal pain and bloating with differential diagnosis including gastritis, constipation, ulcer, and H. pylori infection. Less likely to be acute pancreatitis or appendicitis. - Recommend OTC Prilosec or Nexium for acid reduction. - Order CBC, liver and kidney function tests, and lipase. - Perform H. pylori breath test. - Consider Miralax for suspected constipation. - Advise emergency care if pain worsens or appendicitis symptoms develop.   I spent a total of 45 minutes on this visit, today 02/03/24, which included reviewing chart, ordering tests, discussing plan of care with patient and using shared-decision making on next steps, refilling medications, and documenting the findings in the note.    Lucie Buttner, PA-C

## 2024-02-06 ENCOUNTER — Ambulatory Visit: Admitting: Physician Assistant

## 2024-02-06 ENCOUNTER — Ambulatory Visit: Payer: Self-pay | Admitting: Physician Assistant

## 2024-02-06 LAB — COMPREHENSIVE METABOLIC PANEL WITH GFR
AG Ratio: 1.8 (calc) (ref 1.0–2.5)
ALT: 11 U/L (ref 6–29)
AST: 13 U/L (ref 10–35)
Albumin: 4.2 g/dL (ref 3.6–5.1)
Alkaline phosphatase (APISO): 68 U/L (ref 31–125)
BUN: 13 mg/dL (ref 7–25)
CO2: 27 mmol/L (ref 20–32)
Calcium: 9 mg/dL (ref 8.6–10.2)
Chloride: 107 mmol/L (ref 98–110)
Creat: 0.63 mg/dL (ref 0.50–0.99)
Globulin: 2.4 g/dL (ref 1.9–3.7)
Glucose, Bld: 91 mg/dL (ref 65–99)
Potassium: 4 mmol/L (ref 3.5–5.3)
Sodium: 141 mmol/L (ref 135–146)
Total Bilirubin: 0.4 mg/dL (ref 0.2–1.2)
Total Protein: 6.6 g/dL (ref 6.1–8.1)
eGFR: 109 mL/min/1.73m2 (ref >=60)

## 2024-02-06 LAB — CBC WITH DIFFERENTIAL/PLATELET
Absolute Lymphocytes: 1941 {cells}/uL (ref 850–3900)
Absolute Monocytes: 431 {cells}/uL (ref 200–950)
Basophils Absolute: 41 {cells}/uL (ref 0–200)
Basophils Relative: 0.7 %
Eosinophils Absolute: 59 {cells}/uL (ref 15–500)
Eosinophils Relative: 1 %
HCT: 37.9 % (ref 35.0–45.0)
Hemoglobin: 12.1 g/dL (ref 11.7–15.5)
MCH: 30 pg (ref 27.0–33.0)
MCHC: 31.9 g/dL — ABNORMAL LOW (ref 32.0–36.0)
MCV: 94 fL (ref 80.0–100.0)
MPV: 10.5 fL (ref 7.5–12.5)
Monocytes Relative: 7.3 %
Neutro Abs: 3428 {cells}/uL (ref 1500–7800)
Neutrophils Relative %: 58.1 %
Platelets: 284 Thousand/uL (ref 140–400)
RBC: 4.03 Million/uL (ref 3.80–5.10)
RDW: 12.5 % (ref 11.0–15.0)
Total Lymphocyte: 32.9 %
WBC: 5.9 Thousand/uL (ref 3.8–10.8)

## 2024-02-06 LAB — LIPASE: Lipase: 32 U/L (ref 7–60)

## 2024-02-06 LAB — H. PYLORI BREATH TEST: H. pylori Breath Test: DETECTED — AB

## 2024-02-07 MED ORDER — BISMUTH/METRONIDAZ/TETRACYCLIN 140-125-125 MG PO CAPS: 3.0000 | ORAL_CAPSULE | Freq: Three times a day (TID) | ORAL | 0 refills | Status: AC

## 2024-02-07 MED ORDER — PANTOPRAZOLE SODIUM 40 MG PO TBEC: 40.0000 mg | DELAYED_RELEASE_TABLET | Freq: Two times a day (BID) | ORAL | 0 refills | Status: AC

## 2024-02-21 ENCOUNTER — Other Ambulatory Visit: Payer: Self-pay | Admitting: Physician Assistant

## 2024-02-21 DIAGNOSIS — A048 Other specified bacterial intestinal infections: Secondary | ICD-10-CM

## 2024-02-21 NOTE — Telephone Encounter (Signed)
 Samantha, please clarify when pt needs lab appt.

## 2024-03-16 ENCOUNTER — Other Ambulatory Visit

## 2024-03-29 ENCOUNTER — Other Ambulatory Visit

## 2024-03-29 DIAGNOSIS — A048 Other specified bacterial intestinal infections: Secondary | ICD-10-CM

## 2024-03-30 LAB — HELICOBACTER PYLORI  SPECIAL ANTIGEN
MICRO NUMBER:: 17169859
SPECIMEN QUALITY: ADEQUATE

## 2024-04-01 ENCOUNTER — Ambulatory Visit: Payer: Self-pay | Admitting: Physician Assistant

## 2024-05-09 LAB — HM MAMMOGRAPHY

## 2024-05-28 ENCOUNTER — Other Ambulatory Visit: Payer: Self-pay | Admitting: Nurse Practitioner

## 2024-05-28 DIAGNOSIS — E041 Nontoxic single thyroid nodule: Secondary | ICD-10-CM

## 2024-05-29 ENCOUNTER — Ambulatory Visit: Payer: Self-pay

## 2024-05-29 NOTE — Telephone Encounter (Signed)
" °  FYI Only or Action Required?: FYI only for provider: ED advised and pt declined states going to UC, CAL notified.  Patient was last seen in primary care on 02/03/2024 by Job Lukes, PA.  Called Nurse Triage reporting Cough.  Symptoms began several days ago.  Interventions attempted: Nothing.  Symptoms are: gradually worsening.  Triage Disposition: Go to ED Now (Notify PCP)  Patient/caregiver understands and will follow disposition?: No  Copied from CRM [id number]. Topic: Clinical - Red Word Triage >> May 29, 2024  9:05 AM Amy B wrote: Red Word that prompted transfer to Nurse Triage: Cough with chest pain and tightness Reason for Disposition  Chest pain  (Exception: MILD central chest pain, present only when coughing.)  Answer Assessment - Initial Assessment Questions 1. ONSET: When did the cough begin?      Few days 2. SEVERITY: How bad is the cough today?      Mild-moderate 3. SPUTUM: Describe the color of your sputum (e.g., none, dry cough; clear, white, yellow, green)     Green/ clear 4. HEMOPTYSIS: Are you coughing up any blood? If Yes, ask: How much? (e.g., flecks, streaks, tablespoons, etc.)     no 5. DIFFICULTY BREATHING: Are you having difficulty breathing? If Yes, ask: How bad is it? (e.g., mild, moderate, severe)      denies 6. FEVER: Do you have a fever? If Yes, ask: What is your temperature, how was it measured, and when did it start?     denies 7. CARDIAC HISTORY: Do you have any history of heart disease? (e.g., heart attack, congestive heart failure)      no 8. LUNG HISTORY: Do you have any history of lung disease?  (e.g., pulmonary embolus, asthma, emphysema)     no 9. PE RISK FACTORS: Do you have a history of blood clots? (or: recent major surgery, recent prolonged travel, bedridden)     no 10. OTHER SYMPTOMS: Do you have any other symptoms? (e.g., runny nose, wheezing, chest pain)       Cp 5/10 constant  Protocols used: Cough  - Acute Productive-A-AH  "

## 2024-05-29 NOTE — Telephone Encounter (Signed)
 Please see triage note for Job pt and refusal to go to ED and advise

## 2024-05-29 NOTE — Telephone Encounter (Signed)
 Called pt and unable to reach; lvm with provider agreement in recommendations with office cb number if needing to return our call

## 2024-06-11 ENCOUNTER — Ambulatory Visit
Admission: RE | Admit: 2024-06-11 | Discharge: 2024-06-11 | Disposition: A | Source: Ambulatory Visit | Attending: Nurse Practitioner

## 2024-06-11 DIAGNOSIS — E041 Nontoxic single thyroid nodule: Secondary | ICD-10-CM
